# Patient Record
Sex: Female | Born: 1975
Health system: Southern US, Community
[De-identification: ages and names within clinical notes are randomized; demographics above are authoritative.]

## PROBLEM LIST (undated history)

## (undated) DIAGNOSIS — F419 Anxiety disorder, unspecified: Secondary | ICD-10-CM

## (undated) DIAGNOSIS — F32A Depression, unspecified: Secondary | ICD-10-CM

## (undated) DIAGNOSIS — F329 Major depressive disorder, single episode, unspecified: Secondary | ICD-10-CM

## (undated) DIAGNOSIS — R011 Cardiac murmur, unspecified: Secondary | ICD-10-CM

## (undated) DIAGNOSIS — L309 Dermatitis, unspecified: Secondary | ICD-10-CM

## (undated) DIAGNOSIS — T7840XA Allergy, unspecified, initial encounter: Secondary | ICD-10-CM

## (undated) HISTORY — DX: Dermatitis, unspecified: L30.9

## (undated) HISTORY — DX: Depression, unspecified: F32.A

## (undated) HISTORY — DX: Cardiac murmur, unspecified: R01.1

## (undated) HISTORY — DX: Allergy, unspecified, initial encounter: T78.40XA

## (undated) HISTORY — DX: Major depressive disorder, single episode, unspecified: F32.9

## (undated) HISTORY — DX: Anxiety disorder, unspecified: F41.9

---

## 2000-03-16 ENCOUNTER — Other Ambulatory Visit: Admission: RE | Admit: 2000-03-16 | Discharge: 2000-03-16 | Payer: Self-pay | Admitting: Gynecology

## 2001-02-22 ENCOUNTER — Other Ambulatory Visit: Admission: RE | Admit: 2001-02-22 | Discharge: 2001-02-22 | Payer: Self-pay | Admitting: Gynecology

## 2002-02-27 ENCOUNTER — Other Ambulatory Visit: Admission: RE | Admit: 2002-02-27 | Discharge: 2002-02-27 | Payer: Self-pay | Admitting: Gynecology

## 2002-05-04 ENCOUNTER — Encounter: Payer: Self-pay | Admitting: Emergency Medicine

## 2002-05-04 ENCOUNTER — Emergency Department (HOSPITAL_COMMUNITY): Admission: EM | Admit: 2002-05-04 | Discharge: 2002-05-04 | Payer: Self-pay | Admitting: Emergency Medicine

## 2003-03-17 ENCOUNTER — Other Ambulatory Visit: Admission: RE | Admit: 2003-03-17 | Discharge: 2003-03-17 | Payer: Self-pay | Admitting: Gynecology

## 2003-08-30 ENCOUNTER — Emergency Department (HOSPITAL_COMMUNITY): Admission: EM | Admit: 2003-08-30 | Discharge: 2003-08-30 | Payer: Self-pay

## 2004-05-10 ENCOUNTER — Other Ambulatory Visit: Admission: RE | Admit: 2004-05-10 | Discharge: 2004-05-10 | Payer: Self-pay | Admitting: Gynecology

## 2005-05-16 ENCOUNTER — Other Ambulatory Visit: Admission: RE | Admit: 2005-05-16 | Discharge: 2005-05-16 | Payer: Self-pay | Admitting: Gynecology

## 2006-05-17 ENCOUNTER — Other Ambulatory Visit: Admission: RE | Admit: 2006-05-17 | Discharge: 2006-05-17 | Payer: Self-pay | Admitting: Gynecology

## 2007-05-18 ENCOUNTER — Other Ambulatory Visit: Admission: RE | Admit: 2007-05-18 | Discharge: 2007-05-18 | Payer: Self-pay | Admitting: Gynecology

## 2008-06-16 ENCOUNTER — Emergency Department (HOSPITAL_COMMUNITY): Admission: EM | Admit: 2008-06-16 | Discharge: 2008-06-16 | Payer: Self-pay | Admitting: Emergency Medicine

## 2008-08-08 ENCOUNTER — Encounter: Admission: RE | Admit: 2008-08-08 | Discharge: 2008-08-08 | Payer: Self-pay

## 2008-10-24 ENCOUNTER — Encounter: Admission: RE | Admit: 2008-10-24 | Discharge: 2008-10-24 | Payer: Self-pay | Admitting: Family Medicine

## 2010-06-02 LAB — URINALYSIS, ROUTINE W REFLEX MICROSCOPIC
Bilirubin Urine: NEGATIVE
Glucose, UA: NEGATIVE mg/dL
Ketones, ur: NEGATIVE mg/dL
Leukocytes, UA: NEGATIVE
Nitrite: NEGATIVE
Protein, ur: 100 mg/dL — AB
Specific Gravity, Urine: 1.016 (ref 1.005–1.030)
Urobilinogen, UA: 0.2 mg/dL (ref 0.0–1.0)
pH: 5.5 (ref 5.0–8.0)

## 2010-06-02 LAB — URINE MICROSCOPIC-ADD ON

## 2010-06-02 LAB — POCT PREGNANCY, URINE: Preg Test, Ur: NEGATIVE

## 2010-10-13 IMAGING — US US RENAL
1 series · 14 of 25 positions shown · non-contrast
Comparison: None

CLINICAL DATA: Hematuria and proteinuria.

RENAL/URINARY TRACT ULTRASOUND COMPLETE

[Series 1: us renal · 0.24mm/px · 14 of 32 slices shown]
[im 1/32]
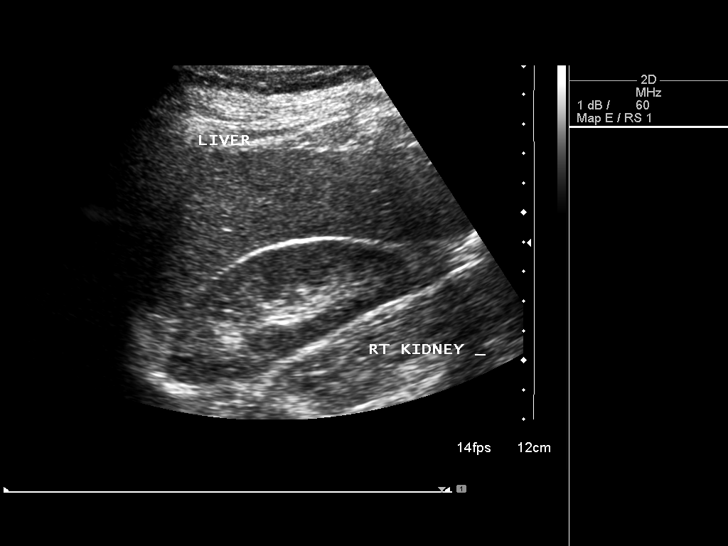
[im 3/32]
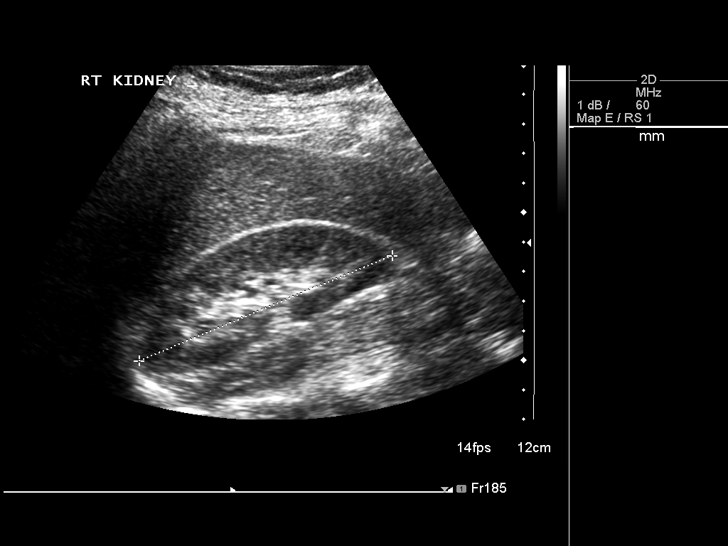
[im 6/32]
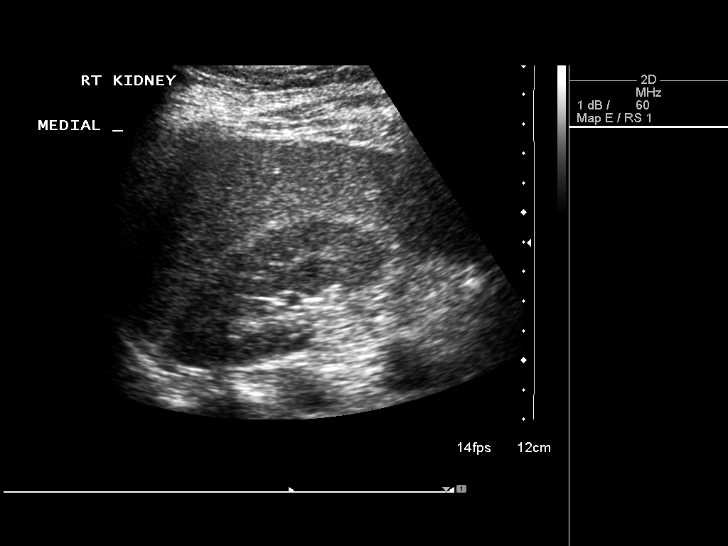
[im 8/32]
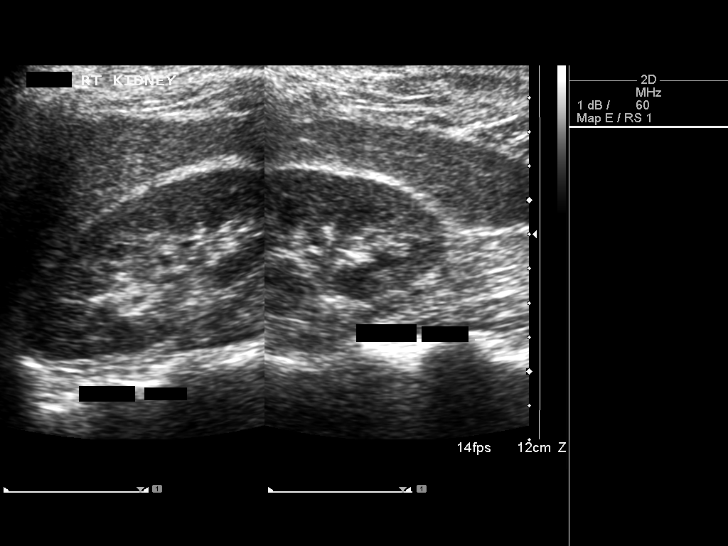
[im 11/32]
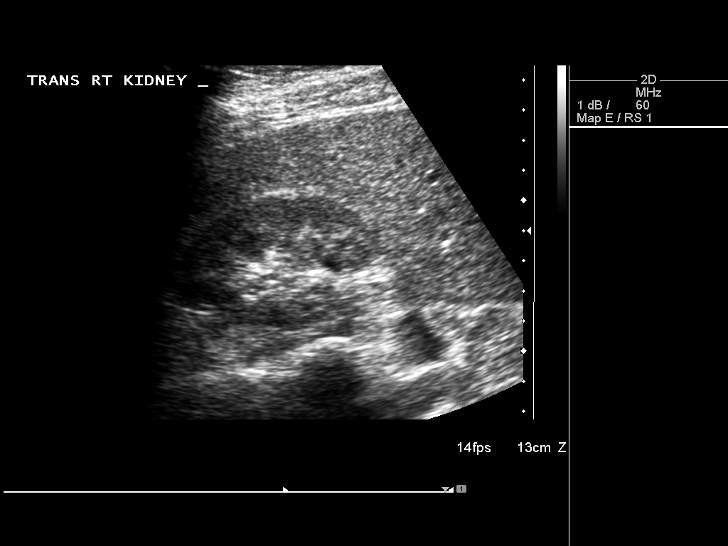
[im 12/32]
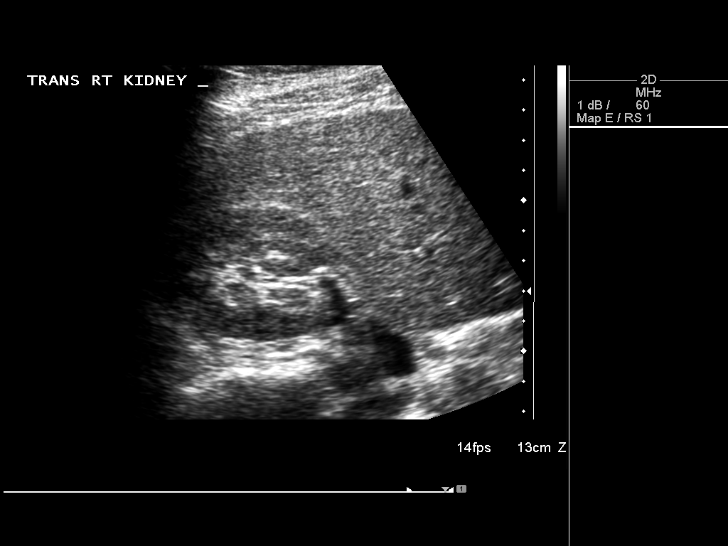
[im 15/32]
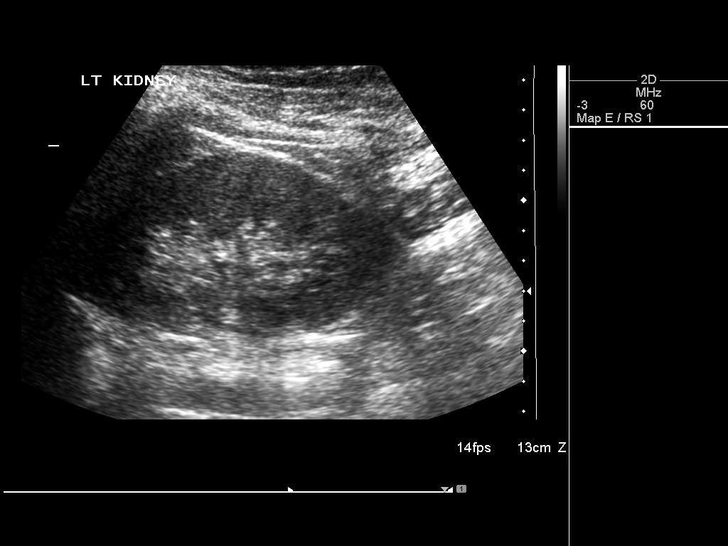
[im 17/32]
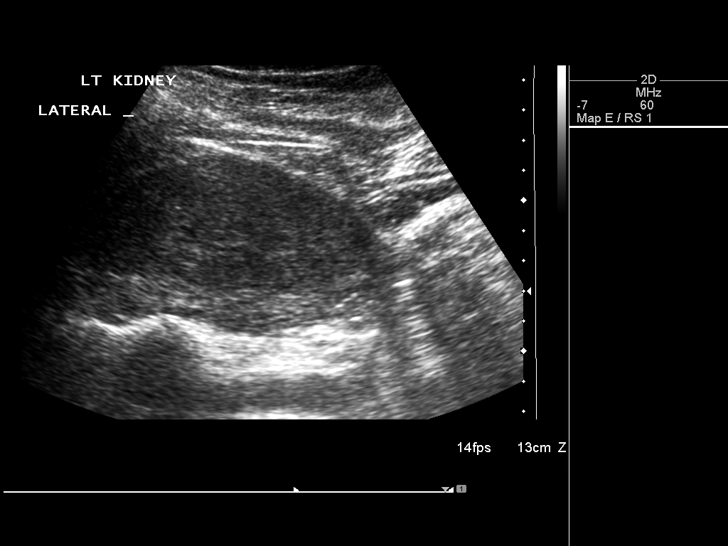
[im 20/32]
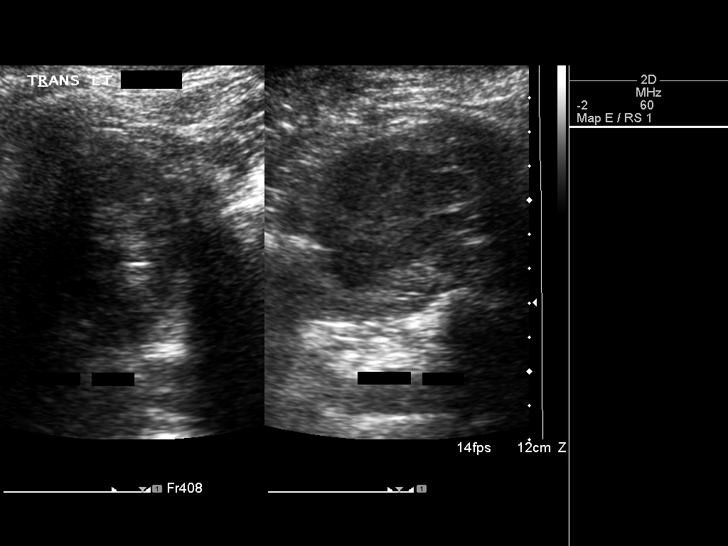
[im 21/32]
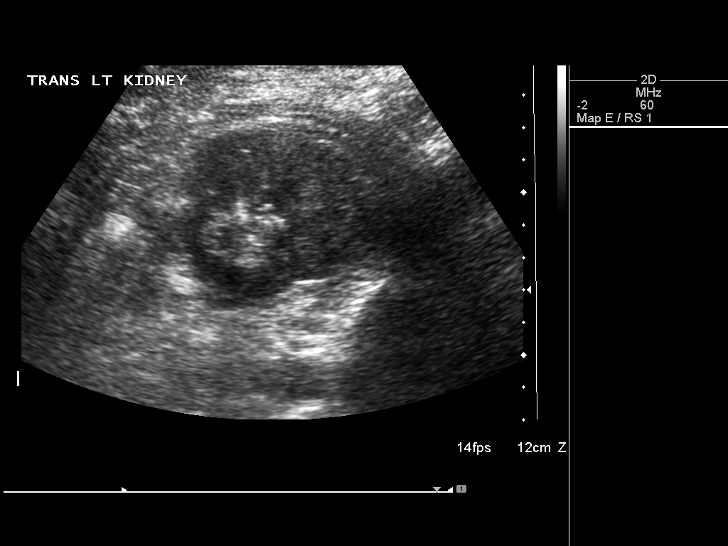
[im 24/32]
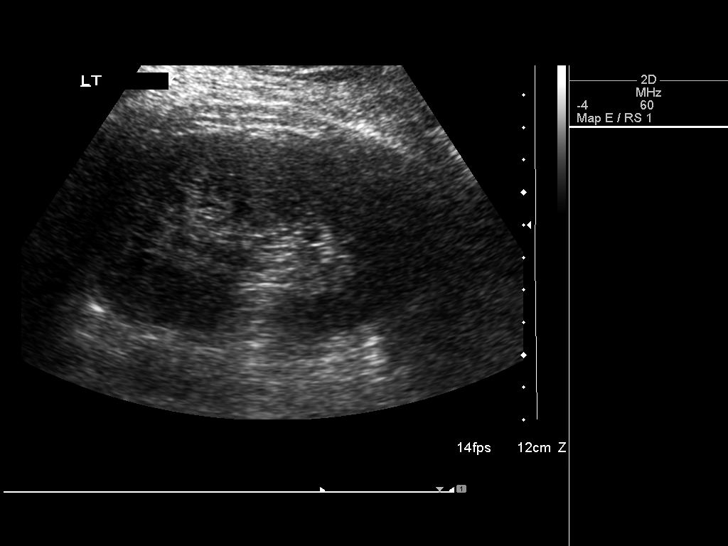
[im 26/32]
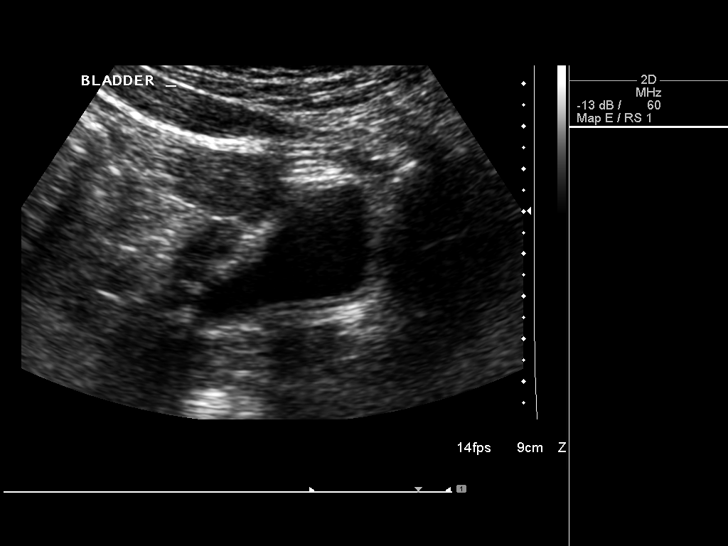
[im 29/32]
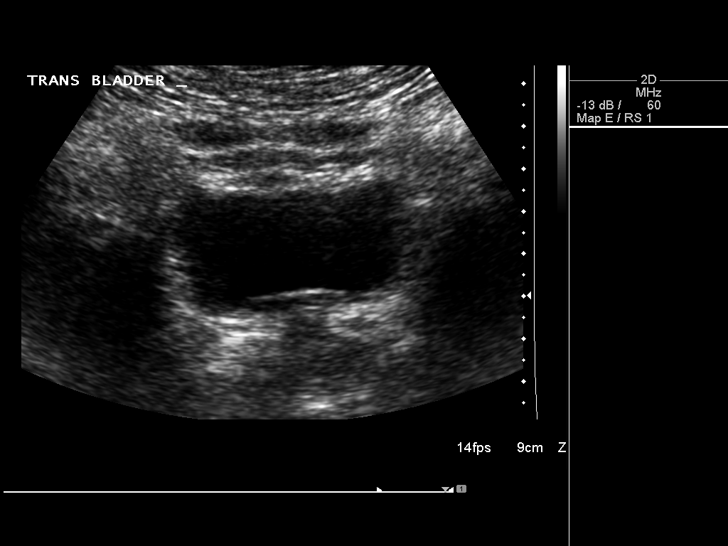
[im 32/32]
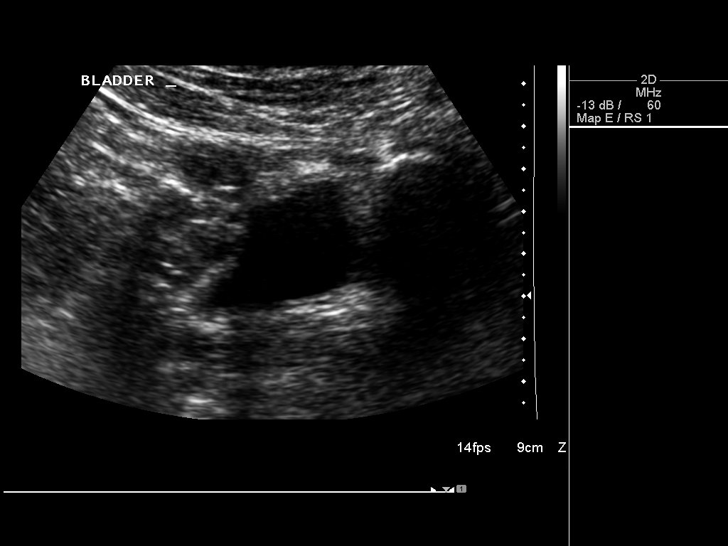

[14 of 25 positions shown; findings below may reference images not displayed]

FINDINGS: Right Kidney:  Measures 9.7 cm, negative.

Left Kidney:  Measures 10.8 cm, negative.

Bladder:  Negative.
IMPRESSION: Normal renal ultrasound.

## 2012-08-21 ENCOUNTER — Ambulatory Visit (INDEPENDENT_AMBULATORY_CARE_PROVIDER_SITE_OTHER): Payer: 59 | Admitting: Family

## 2012-08-21 ENCOUNTER — Encounter: Payer: Self-pay | Admitting: Family

## 2012-08-21 VITALS — BP 108/78 | HR 67 | Ht 64.75 in | Wt 181.5 lb

## 2012-08-21 DIAGNOSIS — F329 Major depressive disorder, single episode, unspecified: Secondary | ICD-10-CM

## 2012-08-21 DIAGNOSIS — N943 Premenstrual tension syndrome: Secondary | ICD-10-CM

## 2012-08-21 MED ORDER — BUPROPION HCL ER (XL) 150 MG PO TB24: 150.0000 mg | ORAL_TABLET | Freq: Every day | ORAL | Status: DC

## 2012-08-21 NOTE — Progress Notes (Signed)
  Subjective:    Patient ID: Natalie Gilbert, female    DOB: [birth year], 37 y.o.   MRN: [id number]  HPI Pt is a 37 year old [place] American female who presents to the PCP with feeling of depression. States sx appear 1-2 weeks prior to the start of her menstrual cycle. Denies her flow being overly painful, but reports a heavy flow. 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 Denies any pain, feelings of hopelessness or suicidal/homicidal ideations.    Review of Systems  Constitutional: Negative.   HENT: Negative.   Eyes: Negative.   Respiratory: Negative.   Cardiovascular: Negative.   Gastrointestinal: Negative.   Endocrine: Negative.   Genitourinary: Negative.   Musculoskeletal: Negative.   Skin: Negative.   Allergic/Immunologic: Negative.   Neurological: Negative.   Hematological: Negative.   Psychiatric/Behavioral: Positive for dysphoric mood.   Past Medical History  Diagnosis Date  . Heart murmur     History   Social History  . Marital Status: Single    Spouse Name: N/A    Number of Children: N/A  . Years of Education: N/A   Occupational History  . Not on file.   Social History Main Topics  . Smoking status: Never Smoker   . Smokeless tobacco: Not on file  . Alcohol Use: No  . Drug Use: No  . Sexually Active: Not on file   Other Topics Concern  . Not on file   Social History Narrative  . No narrative on file    No past surgical history on file.  Family History  Problem Relation Age of Onset  . Hypertension Mother   . Heart murmur Father   . Hypertension Brother     No Known Allergies  No current outpatient prescriptions on file prior to visit.   No current facility-administered medications on file prior to visit.    BP 108/78  Pulse 67  Ht 5' 4.75" (1.645 m)  Wt 181 lb 8 oz (82.328 kg)  BMI 30.42 kg/m2  SpO2 96%  LMP  06/27/2014chart    Objective:   Physical Exam  Constitutional: She is oriented to person, place, and time. She appears well-developed and well-nourished.  HENT:  Head: Normocephalic and atraumatic.  Eyes: Pupils are equal, round, and reactive to light.  Neck: Normal range of motion.  Cardiovascular: Normal rate and regular rhythm.   Pulmonary/Chest: Effort normal and breath sounds normal.  Abdominal: Soft.  Musculoskeletal: Normal range of motion.  Neurological: She is alert and oriented to person, place, and time.  Skin: Skin is warm and dry.          Assessment & Plan:  1. PMS 2. Depression  Pt prescribed daily Wellbutrin to manage symptoms of depression. Instructed to return to PCP in 4 weeks to evaluate the effectiveness of this therapy. Pt instructed to contact PCP sooner if any questions or concerns arise. Also instructed to set up annual physical for routine health maintenance, since pt can not recall date of last physical.

## 2012-08-21 NOTE — Patient Instructions (Addendum)
Premenstrual Syndrome Premenstrual syndrome (PMS) is a condition that consists of physical, emotional, and behavioral symptoms that affect women of childbearing age. PMS occurs 5 14 days before the start of a menstrual period and often recurs in a predictable pattern. The symptoms go away a few days after the menstrual period starts. PMS can interfere in many ways with normal daily activities and can range from mild to severe. When PMS is considered severe, it may be diagnosed as premenstrual dysphoric disorder (PMDD). A small percentage of women are affected by PMS symptoms and an even smaller percentage of those women are affected by PMDD.  CAUSES  The exact cause of PMS is unknown, but it seems to be related to cyclic hormone changes that happen before menstruation. These hormones are thought to affect chemicals in the brain (serotonin) that can influence a person's mood.  SYMPTOMS  Symptoms of PMS recur consistently from month to month and go away completely after the menstrual period starts. The most common emotional or behavioral symptom is mood swings. These mood swings can be disabling and interfere with normal activities of daily living. Other common symptoms include depression and angry outbursts. Other symptoms may include:   Irritability.  Anxiety.  Crying spells.   Food cravings or appetite changes.   Changes in sexual desire.   Confusion.   Aggression.   Social withdrawal.   Poor concentration. The most common physical symptoms include a sense of bloating, breast pain, headaches, and extreme fatigue. Other physical symptoms include:   Backaches.   Swelling of the hands and feet.   Weight gain.   Hot flashes.  DIAGNOSIS  To make a diagnosis, your caregiver will ask questions to confirm that you are having a pattern of symptoms. Symptoms must:   Be present 5 days before the start of your period and be present at least 3 months in a row.   End within 4 days  after your period starts.   Interfere with some of your normal activities.  Other conditions, such as thyroid disease, depression, and migraine headaches must be ruled out before a diagnosis of PMS is confirmed.  TREATMENT  Your caregiver may suggest ways to maintain a healthy lifestyle, such as exercise. Over-the-counter pain relievers may ease cramps, aches, pains, headaches, and breast tenderness. However, selective serotonin reuptake inhibitors (SSRIs) are medicines that are most beneficial in improving PMS if taken in the second half of the monthly cycle. They may be taken on a daily basis. The most effective oral contraceptive pill used for symptoms of PMS is one that contains the ingredient drospirenone. Taking 4 days off of the pill instead of the usual 7 days also has shown to increase effectiveness.  There are a number of drugs, dietary supplements, vitamins, and water pills (diuretics) which have been suggested to be helpful but have not shown to be of any benefit to improving PMS symptoms.  HOME CARE INSTRUCTIONS   For 2 3 months, write down your symptoms, their severity, and how long they last. This may help your caregiver prescribe the best treatment for your symptoms.  Exercise regularly as suggested by your caregiver.  Eat a regular, well-balanced diet.  Avoid caffeine, alcohol, and tobacco consumption.  Limit salt and salty foods to lessen bloating and fluid retention.  Get enough sleep. Practice relaxation techniques.  Drink enough fluids to keep your urine clear or pale yellow.  Take medicines as directed by your caregiver.  Limit stress.  Take a multivitamin as directed   by your caregiver. Document Released: 02/05/2000 Document Revised: 11/02/2011 Document Reviewed: 06/27/2011 Exit[employer] Patient Information 2014 [employer].  

## 2012-09-11 ENCOUNTER — Telehealth: Payer: Self-pay | Admitting: Family

## 2012-09-11 ENCOUNTER — Ambulatory Visit: Payer: 59 | Admitting: Emergency Medicine

## 2012-09-11 VITALS — BP 140/84 | HR 72 | Temp 98.6°F | Resp 16 | Ht 65.0 in | Wt 185.0 lb

## 2012-09-11 DIAGNOSIS — L500 Allergic urticaria: Secondary | ICD-10-CM

## 2012-09-11 MED ORDER — METHYLPREDNISOLONE ACETATE 80 MG/ML IJ SUSP
120.0000 mg | Freq: Once | INTRAMUSCULAR | Status: AC
  Administered 2012-09-11: 120 mg via INTRAMUSCULAR

## 2012-09-11 MED ORDER — CYPROHEPTADINE HCL 4 MG PO TABS: 4.0000 mg | ORAL_TABLET | Freq: Four times a day (QID) | ORAL | Status: DC | PRN

## 2012-09-11 NOTE — Telephone Encounter (Signed)
Noted  

## 2012-09-11 NOTE — Patient Instructions (Addendum)

## 2012-09-11 NOTE — Telephone Encounter (Signed)
Patient Information:  Caller Name: Chynah  Phone: [phone]  Patient: Natalie Gilbert  Gender: Female  DOB: 27-Dec-1975  Age: 37 Years  PCP: Adline Mango [facility])  Pregnant: No  Office Follow Up:  Does the office need to follow up with this patient?: No  Instructions For The Office: N/A  RN Note:  Today she is itching and having areas of redness from scratching-just on upper body-stomach up. Rates itching at a 5/10 itch scale. Has not done outside work since Saturday. Walked on Sunday and had shellfish.  Patient unable to identify a causative agent.  Triaged with care advice given.  Caller demonstrated her understanding and will call back if does not improve.  Symptoms  Reason For Call & Symptoms: Body itch  with lips swollen and break out on elbows 09/10/12; Has some of the same symptoms but lips and elbows have resolved  Reviewed Health History In EMR: Yes  Reviewed Medications In EMR: Yes  Reviewed Allergies In EMR: Yes  Reviewed Surgeries / Procedures: N/A  Date of Onset of Symptoms: 09/10/2012  Treatments Tried: benadryl  Treatments Tried Worked: Yes OB / GYN:  LMP: 08/10/2012  Guideline(s) Used:  Itching - Widespread  Disposition Per Guideline:   Home Care  Reason For Disposition Reached:   Itching of unknown cause and present < 48 hours  Advice Given:  Reassurance - Itching of Unknown Cause:  The most common cause of itching is dry skin. Itching can also be caused by soaps, chlorine, low humidity, and pollen or other irritants. Sometimes the cause is unknown.  With a few simple measures, the itching will usually get better in 1 to 2 days.  Here is some care advice that should help.  Reassurance - Itching from Swimming in a Chlorinated Pool:  For itching due to chlorine, rinse the chlorine off the body with a brief shower immediately after swimming.  Then apply a skin moisturizing cream.  Here is some more care advice that should help.  Reassurance - Itching from Pollen and Other Allergens:  You need to wash off the allergens  Take a shower to remove pollens, animal dander or other allergic substances from the body and hair.  Here is some more care advice that should help.  Reassurance - Itching from Dry Air:  For itching due to dry air, run a humidifier. This is mainly needed during the winter season when you are using central heat.  After you shower, use an unscented moisturizing lotion (e.g., Eucerin, Lubriderm, Vaseline Intensive Care). Eucerin Creme is especially helpful for dry/chapped hands.  Here is some more care advice that should help.  Don  Try not to scratch.  Itching is often worsened by scratching (the "Itch-Scratch" cycle).  Cut the fingernails short. (Reason: prevent secondary bacterial infection.)  Avoid Soaps:  Avoid all strong soaps (including bubble bath and scented soaps). (Reason: soaps remove natural oils from the skin.) Use gentler soaps like Dove, Olay, or Basis.  Avoid Triggers:  Avoid hot showers and baths. (Reason: heat increases itching.)  Avoid swimming pools. (Reason: chlorine dries out the skin.)  Avoid itchy or tight clothing (especially wool).  Avoid sleeping with too many blankets. (Reason: heat and sweating aggravates itching.)  Moisturize the Skin with Lotion:  The best time to apply lotion is right after a bath or shower when the skin is moist. The lotion or cream will help seal in the moisture.  Eucerin lotion, Lubriderm lotion, or Vaseline Intensive Care lotion all  work well.  Eucerin Creme is a little thicker and is especially helpful for dry/chapped hands.  Oral Antihistamine Medication for Itching:  Take an antihistamine by mouth to reduce the itching. Diphenhydramine (Benadryl) is available over-the-counter. Adult dose is 25-50 mg. Take it up to 4 times a day.  Call Back If:  Rash occurs  Itching becomes worse or lasts over 48 hours  You become worse.  Patient Will Follow  Care Advice:  YES

## 2012-09-11 NOTE — Progress Notes (Signed)
[facility] [address], [place] [address] [phone]  Date:  09/11/2012   Name:  Natalie Gilbert   DOB:  Jul 07, 1975   MRN:  [id number]  PCP:  Janell Quiet, FNP    Chief Complaint: Allergic Reaction   History of Present Illness:  Natalie Gilbert is a 37 y.o. very pleasant female patient who presents with the following:  Onset of hives yesterday.  Migratory. No shortness of breath or wheezing.  Had som swelling of the lips.  No new personal care product or medications.  No documented insect exposure.  No improvement with over the counter medications or other home remedies. Denies other complaint or health concern today.   There are no active problems to display for this patient.   Past Medical History  Diagnosis Date  . Heart murmur     No past surgical history on file.  History  Substance Use Topics  . Smoking status: Never Smoker   . Smokeless tobacco: Not on file  . Alcohol Use: No    Family History  Problem Relation Age of Onset  . Hypertension Mother   . Heart murmur Father   . Hypertension Brother     No Known Allergies  Medication list has been reviewed and updated.  Current Outpatient Prescriptions on File Prior to Visit  Medication Sig Dispense Refill  . buPROPion (WELLBUTRIN XL) 150 MG 24 hr tablet Take 1 tablet (150 mg total) by mouth daily.  30 tablet  1   No current facility-administered medications on file prior to visit.    Review of Systems:  As per HPI, otherwise negative.    Physical Examination: Filed Vitals:   09/11/12 1759  BP: 140/84  Pulse: 72  Temp: 98.6 F (37 C)  Resp: 16   Filed Vitals:   09/11/12 1759  Height: 5\' 5"  (1.651 m)  Weight: 185 lb (83.915 kg)   Body mass index is 30.79 kg/(m^2). Ideal Body Weight: Weight in (lb) to have BMI = 25: 149.9  GEN: WDWN, NAD, Non-toxic, A & O x 3 HEENT: Atraumatic, Normocephalic. Neck supple. No masses, No LAD. Ears and Nose: No external  deformity. CV: RRR, No M/G/R. No JVD. No thrill. No extra heart sounds. PULM: CTA B, no wheezes, crackles, rhonchi. No retractions. No resp. distress. No accessory muscle use. ABD: S, NT, ND, +BS. No rebound. No HSM. EXTR: No c/c/e NEURO Normal gait.  PSYCH: Normally interactive. Conversant. Not depressed or anxious appearing.  Calm demeanor.  SKIN:  hives  Assessment and Plan: Hives Depo Benadryl  Signed,  Phillips Odor, MD

## 2012-09-13 ENCOUNTER — Other Ambulatory Visit: Payer: Self-pay | Admitting: Radiology

## 2012-09-18 ENCOUNTER — Other Ambulatory Visit: Payer: Self-pay | Admitting: Radiology

## 2012-09-18 ENCOUNTER — Other Ambulatory Visit: Payer: Self-pay | Admitting: *Deleted

## 2012-09-18 MED ORDER — CYPROHEPTADINE HCL 4 MG PO TABS: 4.0000 mg | ORAL_TABLET | Freq: Four times a day (QID) | ORAL | Status: DC | PRN

## 2012-09-19 ENCOUNTER — Encounter: Payer: Self-pay | Admitting: Family

## 2012-09-19 ENCOUNTER — Ambulatory Visit (INDEPENDENT_AMBULATORY_CARE_PROVIDER_SITE_OTHER): Payer: 59 | Admitting: Family

## 2012-09-19 VITALS — BP 124/88 | HR 88 | Wt 183.0 lb

## 2012-09-19 DIAGNOSIS — F320 Major depressive disorder, single episode, mild: Secondary | ICD-10-CM | POA: Insufficient documentation

## 2012-09-19 DIAGNOSIS — F32A Depression, unspecified: Secondary | ICD-10-CM | POA: Insufficient documentation

## 2012-09-19 DIAGNOSIS — F329 Major depressive disorder, single episode, unspecified: Secondary | ICD-10-CM

## 2012-09-19 MED ORDER — BUPROPION HCL ER (XL) 150 MG PO TB24: 150.0000 mg | ORAL_TABLET | Freq: Every day | ORAL | Status: DC

## 2012-09-19 NOTE — Progress Notes (Signed)
  Subjective:    Patient ID: Natalie Gilbert, female    DOB: [birth year], 37 y.o.   MRN: [id number]  HPI 37 year old [place] American female, nonsmoker is in for recheck of depression. She's currently on Wellbutrin XL 150 mg once daily and tolerating it very well. She feels like it has definitely lightened her mood. Has minimal concerns of it possibly cause in her not being able to orgasm. However, she is okay with that at this point. Denies any feelings of helplessness, hopelessness, thoughts of death or dying.   Review of Systems  Constitutional: Negative.   Respiratory: Negative.   Cardiovascular: Negative.   Skin: Negative.   Psychiatric/Behavioral: Negative.    Past Medical History  Diagnosis Date  . Heart murmur     History   Social History  . Marital Status: Single    Spouse Name: N/A    Number of Children: N/A  . Years of Education: N/A   Occupational History  . Not on file.   Social History Main Topics  . Smoking status: Never Smoker   . Smokeless tobacco: Not on file  . Alcohol Use: No  . Drug Use: No  . Sexually Active: Not on file   Other Topics Concern  . Not on file   Social History Narrative  . No narrative on file    History reviewed. No pertinent past surgical history.  Family History  Problem Relation Age of Onset  . Hypertension Mother   . Heart murmur Father   . Hypertension Brother     No Known Allergies  Current Outpatient Prescriptions on File Prior to Visit  Medication Sig Dispense Refill  . cyproheptadine (PERIACTIN) 4 MG tablet Take 1 tablet (4 mg total) by mouth 4 (four) times daily as needed.  40 tablet  0   No current facility-administered medications on file prior to visit.    BP 124/88  Pulse 88  Wt 183 lb (83.008 kg)  BMI 30.45 kg/m2  SpO2 98%  LMP 06/27/2014chart    Objective:   Physical Exam  Constitutional: She is oriented to person, place, and time. She appears well-developed and well-nourished.  Neck: Neck  supple. No thyromegaly present.  Cardiovascular: Normal rate, regular rhythm and normal heart sounds.   Pulmonary/Chest: Effort normal and breath sounds normal.  Neurological: She is alert and oriented to person, place, and time.  Skin: Skin is warm and dry.  Psychiatric: She has a normal mood and affect.          Assessment & Plan:  Assessment: 1. Depression-improving  Plan: Continue current medications. Exercise daily. We'll follow the patient in 3 months and sooner as needed.

## 2012-10-08 ENCOUNTER — Telehealth: Payer: Self-pay | Admitting: Family

## 2012-10-08 MED ORDER — BUPROPION HCL ER (XL) 300 MG PO TB24: 300.0000 mg | ORAL_TABLET | Freq: Every day | ORAL | Status: DC

## 2012-10-08 NOTE — Telephone Encounter (Signed)
Pt called to speak to the nurse to see if she can increase her medication for anxiety. Rn tried to call pt and reached vm. Left vm to call the office back.

## 2012-10-08 NOTE — Telephone Encounter (Signed)
Sent to pharmacy, 300mg  Wellbutrin. Recheck in 3 weeks for OV

## 2012-10-08 NOTE — Telephone Encounter (Signed)
Does pt need to schedule OV?

## 2012-10-08 NOTE — Telephone Encounter (Signed)
Left message to advise pt of Padonda's note 

## 2012-10-19 ENCOUNTER — Ambulatory Visit (INDEPENDENT_AMBULATORY_CARE_PROVIDER_SITE_OTHER): Payer: 59 | Admitting: Family

## 2012-10-19 ENCOUNTER — Encounter: Payer: Self-pay | Admitting: Family

## 2012-10-19 VITALS — BP 118/80 | HR 81 | Ht 64.25 in | Wt 180.0 lb

## 2012-10-19 DIAGNOSIS — Z23 Encounter for immunization: Secondary | ICD-10-CM

## 2012-10-19 DIAGNOSIS — N912 Amenorrhea, unspecified: Secondary | ICD-10-CM

## 2012-10-19 DIAGNOSIS — Z0000 Encounter for general adult medical examination without abnormal findings: Secondary | ICD-10-CM

## 2012-10-19 DIAGNOSIS — R7989 Other specified abnormal findings of blood chemistry: Secondary | ICD-10-CM

## 2012-10-19 DIAGNOSIS — F329 Major depressive disorder, single episode, unspecified: Secondary | ICD-10-CM

## 2012-10-19 LAB — COMPREHENSIVE METABOLIC PANEL
ALT: 25 U/L (ref 0–35)
AST: 20 U/L (ref 0–37)
Albumin: 3.8 g/dL (ref 3.5–5.2)
BUN: 11 mg/dL (ref 6–23)
CO2: 24 mEq/L (ref 19–32)
Calcium: 9.3 mg/dL (ref 8.4–10.5)
Chloride: 107 mEq/L (ref 96–112)
GFR: 85.58 mL/min (ref >60.00)
Potassium: 4 mEq/L (ref 3.5–5.1)

## 2012-10-19 LAB — CBC WITH DIFFERENTIAL/PLATELET
Basophils Absolute: 0.1 10*3/uL (ref 0.0–0.1)
Eosinophils Relative: 1.5 % (ref 0.0–5.0)
HCT: 36.5 % (ref 36.0–46.0)
Hemoglobin: 12.1 g/dL (ref 12.0–15.0)
Lymphocytes Relative: 33 % (ref 12.0–46.0)
Lymphs Abs: 3.5 10*3/uL (ref 0.7–4.0)
Monocytes Relative: 5 % (ref 3.0–12.0)
Neutro Abs: 6.3 10*3/uL (ref 1.4–7.7)
RBC: 4.16 Mil/uL (ref 3.87–5.11)
RDW: 13.6 % (ref 11.5–14.6)
WBC: 10.5 10*3/uL (ref 4.5–10.5)

## 2012-10-19 LAB — LIPID PANEL
Cholesterol: 214 mg/dL — ABNORMAL HIGH (ref 0–200)
HDL: 46 mg/dL (ref >39.00)
Total CHOL/HDL Ratio: 5
Triglycerides: 151 mg/dL — ABNORMAL HIGH (ref 0.0–149.0)
VLDL: 30.2 mg/dL (ref 0.0–40.0)

## 2012-10-19 LAB — POCT URINALYSIS DIPSTICK
Nitrite, UA: NEGATIVE
Protein, UA: NEGATIVE
Spec Grav, UA: >=1.03
Urobilinogen, UA: 0.2

## 2012-10-19 MED ORDER — BUPROPION HCL ER (XL) 150 MG PO TB24: 150.0000 mg | ORAL_TABLET | Freq: Every day | ORAL | Status: DC

## 2012-10-19 NOTE — Patient Instructions (Addendum)
Exercise to Stay Healthy Exercise helps you become and stay healthy. EXERCISE IDEAS AND TIPS Choose exercises that:  You enjoy.  Fit into your day. You do not need to exercise really hard to be healthy. You can do exercises at a slow or medium level and stay healthy. You can:  Stretch before and after working out.  Try yoga, Pilates, or tai chi.  Lift weights.  Walk fast, swim, jog, run, climb stairs, bicycle, dance, or rollerskate.  Take aerobic classes. Exercises that burn about 150 calories:  Running 1  miles in 15 minutes.  Playing volleyball for 45 to 60 minutes.  Washing and waxing a car for 45 to 60 minutes.  Playing touch football for 45 minutes.  Walking 1  miles in 35 minutes.  Pushing a stroller 1  miles in 30 minutes.  Playing basketball for 30 minutes.  Raking leaves for 30 minutes.  Bicycling 5 miles in 30 minutes.  Walking 2 miles in 30 minutes.  Dancing for 30 minutes.  Shoveling snow for 15 minutes.  Swimming laps for 20 minutes.  Walking up stairs for 15 minutes.  Bicycling 4 miles in 15 minutes.  Gardening for 30 to 45 minutes.  Jumping rope for 15 minutes.  Washing windows or floors for 45 to 60 minutes. Document Released: 03/12/2010 Document Revised: 05/02/2011 Document Reviewed: 03/12/2010 Exit[employer] Patient Information 2014 [employer].  

## 2012-10-19 NOTE — Progress Notes (Signed)
Subjective:    Patient ID: Natalie Gilbert, female    DOB: [birth year], 37 y.o.   MRN: [id number]  HPI 37 year old [place] American female, nonsmoker is in for complete physical exam. Denies any concerns today. She has a history of depression and is currently taking Wellbutrin 150 mg once daily and tolerating it well. Denies any feelings of helplessness, hopelessness, thoughts of death or dying. She is under the care of gynecology for GYN care.  Reports missing her menstrual cycle in July and wants a pregnancy test.   Review of Systems  Constitutional: Negative.   HENT: Negative.   Eyes: Negative.   Respiratory: Negative.   Cardiovascular: Negative.   Gastrointestinal: Negative.   Endocrine: Negative.   Genitourinary: Negative.   Musculoskeletal: Negative.   Skin: Negative.   Allergic/Immunologic: Negative.   Neurological: Negative.   Hematological: Negative.   Psychiatric/Behavioral: Negative.    Past Medical History  Diagnosis Date  . Heart murmur     History   Social History  . Marital Status: Single    Spouse Name: N/A    Number of Children: N/A  . Years of Education: N/A   Occupational History  . Not on file.   Social History Main Topics  . Smoking status: Never Smoker   . Smokeless tobacco: Not on file  . Alcohol Use: No  . Drug Use: No  . Sexual Activity: Not on file   Other Topics Concern  . Not on file   Social History Narrative  . No narrative on file    No past surgical history on file.  Family History  Problem Relation Age of Onset  . Hypertension Mother   . Heart murmur Father   . Hypertension Brother     No Known Allergies  Current Outpatient Prescriptions on File Prior to Visit  Medication Sig Dispense Refill  . cyproheptadine (PERIACTIN) 4 MG tablet Take 1 tablet (4 mg total) by mouth 4 (four) times daily as needed.  40 tablet  0   No current facility-administered medications on file prior to visit.    BP 118/80  Pulse 81  Ht 5'  4.25" (1.632 m)  Wt 180 lb (81.647 kg)  BMI 30.65 kg/m2chart    Objective:   Physical Exam  Constitutional: She is oriented to person, place, and time. She appears well-developed and well-nourished.  HENT:  Head: Normocephalic and atraumatic.  Right Ear: External ear normal.  Left Ear: External ear normal.  Nose: Nose normal.  Mouth/Throat: Oropharynx is clear and moist.  Eyes: Conjunctivae and EOM are normal. Pupils are equal, round, and reactive to light.  Neck: Normal range of motion. Neck supple. No thyromegaly present.  Cardiovascular: Normal rate, regular rhythm, normal heart sounds and intact distal pulses.  Exam reveals no gallop and no friction rub.   No murmur heard. Pulmonary/Chest: Effort normal and breath sounds normal.  Abdominal: Soft. Bowel sounds are normal. She exhibits no distension. There is no tenderness. There is no rebound.  Musculoskeletal: Normal range of motion. She exhibits no edema and no tenderness.  Neurological: She is alert and oriented to person, place, and time. She has normal reflexes.  Skin: Skin is warm and dry.  Psychiatric: She has a normal mood and affect.          Assessment & Plan:  Assessment: 1. Complete physical exam 2. Depression  Plan: Lab said to include TSH, CBC, lipids, urine, urine pregnancy test without patient and the results. Encouraged healthy diet, exercise, monthly self  breast exams. We'll follow up with patient in the results of her labs, in 6 months and sooner as needed.

## 2012-12-21 ENCOUNTER — Ambulatory Visit: Payer: 59 | Admitting: Family

## 2013-01-28 ENCOUNTER — Other Ambulatory Visit: Payer: Self-pay

## 2013-01-28 MED ORDER — BUPROPION HCL ER (XL) 150 MG PO TB24: 150.0000 mg | ORAL_TABLET | Freq: Every day | ORAL | Status: DC

## 2013-02-05 ENCOUNTER — Other Ambulatory Visit: Payer: Self-pay

## 2013-02-05 MED ORDER — BUPROPION HCL ER (XL) 150 MG PO TB24: 150.0000 mg | ORAL_TABLET | Freq: Every day | ORAL | Status: DC

## 2013-02-05 NOTE — Telephone Encounter (Signed)
Request to change wellbutrin to 90 day supply for insurance coverage  done

## 2013-07-12 ENCOUNTER — Telehealth: Payer: Self-pay | Admitting: Family

## 2013-07-12 NOTE — Telephone Encounter (Signed)
Pt requested call back from triage RN for "dizziness/headache." Triage RN unable to reach pt and left vm asking her to call back.

## 2013-08-09 ENCOUNTER — Ambulatory Visit (INDEPENDENT_AMBULATORY_CARE_PROVIDER_SITE_OTHER): Payer: 59 | Admitting: Physician Assistant

## 2013-08-09 VITALS — BP 134/86 | HR 67 | Temp 99.1°F | Resp 18 | Ht 65.0 in | Wt 177.0 lb

## 2013-08-09 DIAGNOSIS — T783XXA Angioneurotic edema, initial encounter: Secondary | ICD-10-CM

## 2013-08-09 MED ORDER — METHYLPREDNISOLONE SODIUM SUCC 125 MG IJ SOLR
125.0000 mg | Freq: Once | INTRAMUSCULAR | Status: AC
  Administered 2013-08-09: 125 mg via INTRAMUSCULAR

## 2013-08-09 MED ORDER — RANITIDINE HCL 300 MG PO TABS: 300.0000 mg | ORAL_TABLET | Freq: Every day | ORAL | Status: DC

## 2013-08-09 MED ORDER — CETIRIZINE HCL 10 MG PO TABS: 10.0000 mg | ORAL_TABLET | Freq: Every day | ORAL | Status: DC

## 2013-08-09 MED ORDER — CETIRIZINE HCL 5 MG PO TABS
10.0000 mg | ORAL_TABLET | Freq: Once | ORAL | Status: AC
  Administered 2013-08-09: 10 mg via ORAL

## 2013-08-09 MED ORDER — RANITIDINE HCL 150 MG PO TABS
300.0000 mg | ORAL_TABLET | Freq: Once | ORAL | Status: AC
  Administered 2013-08-09: 300 mg via ORAL

## 2013-08-09 MED ORDER — PREDNISONE 10 MG PO TABS: ORAL_TABLET | ORAL | Status: AC

## 2013-08-09 NOTE — Progress Notes (Signed)
   Subjective:    Patient ID: Natalie Gilbert, female    DOB: 04/20/1975, 38 y.o.   MRN: [id number]  HPI Pt presents to clinic with upper lip swelling since last pm.  She ate [employer] last pm but she has had that before without problems.  She did eat a few chicken wings prior to that that she had not eaten before.  She noticed several hours later that she had swelling of her upper lip only and none to her lower.  She having no trouble breathing or feelings of tongue swelling.  She has never had this before.  She took a benadryl last pm.  She has noticed continued swelling since she woke up this am.   Review of Systems  Constitutional: Negative for fever and chills.  HENT: Positive for facial swelling.   Respiratory: Negative for cough and shortness of breath.        Objective:   Physical Exam  Vitals reviewed. Constitutional: She is oriented to person, place, and time. She appears well-developed and well-nourished.  HENT:  Head: Normocephalic and atraumatic.  Right Ear: External ear normal.  Left Ear: External ear normal.  Upper lip edematous.  No low lip swelling.  No tongue swelling.  Uvula is WNL.  Pulmonary/Chest: Effort normal.  Neurological: She is alert and oriented to person, place, and time.  Skin: Skin is warm and dry.  Psychiatric: She has a normal mood and affect. Her behavior is normal. Judgment and thought content normal.        Assessment & Plan:  Angioedema, initial encounter - Plan: cetirizine (ZYRTEC) tablet 10 mg, ranitidine (ZANTAC) tablet 300 mg, cetirizine (ZYRTEC) 10 MG tablet, ranitidine (ZANTAC) 300 MG tablet, methylPREDNISolone sodium succinate (SOLU-MEDROL) 125 mg/2 mL injection 125 mg, predniSONE (DELTASONE) 10 MG tablet  D/w pt warning signs and what to look for.  Her questions were answered and she agrees with the above plan.  Windell Hummingbird PA-C  [facility] 08/09/2013 1:41 PM

## 2013-08-09 NOTE — Patient Instructions (Signed)
Take the Zyrtec and zantac for at least 10 days.  Angioedema Angioedema is a sudden swelling of tissues, often of the skin. It can occur on the face or genitals or in the abdomen or other body parts. The swelling usually develops over a short period and gets better in 24 to 48 hours. It often begins during the night and is found when the person wakes up. The person may also get red, itchy patches of skin (hives). Angioedema can be dangerous if it involves swelling of the air passages.  Depending on the cause, episodes of angioedema may only happen once, come back in unpredictable patterns, or repeat for several years and then gradually fade away.  CAUSES  Angioedema can be caused by an allergic reaction to various triggers. It can also result from nonallergic causes, including reactions to drugs, immune system disorders, viral infections, or an abnormal gene that is passed to you from your parents (hereditary). For some people with angioedema, the cause is unknown.  Some things that can trigger angioedema include:   Foods.   Medicines, such as ACE inhibitors, ARBs, nonsteroidal anti-inflammatory agents, or estrogen.   Latex.   Animal saliva.   Insect stings.   Dyes used in X-rays.   Mild injury.   Dental work.  Surgery.  Stress.   Sudden changes in temperature.   Exercise. SIGNS AND SYMPTOMS   Swelling of the skin.  Hives. If these are present, there is also intense itching.  Redness in the affected area.   Pain in the affected area.  Swollen lips or tongue.  Breathing problems. This may happen if the air passages swell.  Wheezing. If internal organs are involved, there may be:   Nausea.   Abdominal pain.   Vomiting.   Difficulty swallowing.   Difficulty passing urine. DIAGNOSIS   Your health care provider will examine the affected area and take a medical and family history.  Various tests may be done to help determine the cause. Tests may  include:  Allergy skin tests to see if the problem is an allergic reaction.   Blood tests to check for hereditary angioedema.   Tests to check for underlying diseases that could cause the condition.   A review of your medicines, including over the counter medicines, may be done. TREATMENT  Treatment will depend on the cause of the angioedema. Possible treatments include:   Removal of anything that triggered the condition (such as stopping certain medicines).   Medicines to treat symptoms or prevent attacks. Medicines given may include:   Antihistamines.   Epinephrine injection.   Steroids.   Hospitalization may be required for severe attacks. If the air passages are affected, it can be an emergency. Tubes may need to be placed to keep the airway open. HOME CARE INSTRUCTIONS   Only take over-the-counter or prescription medicines as directed by your health care provider.  If you were given medicines for emergency allergy treatment, always carry them with you.  Wear a medical bracelet as directed by your health care provider.   Avoid known triggers. SEEK MEDICAL CARE IF:   You have repeat attacks of angioedema.   Your attacks are more frequent or more severe despite preventive measures.   You have hereditary angioedema and are considering having children. It is important to discuss the risks of passing the condition on to your children with your health care provider. SEEK IMMEDIATE MEDICAL CARE IF:   You have severe swelling of the mouth, tongue, or lips.  You have difficulty breathing.   You have difficulty swallowing.   You faint. MAKE SURE YOU:  Understand these instructions.  Will watch your condition.  Will get help right away if you are not doing well or get worse. Document Released: 04/18/2001 Document Revised: 11/28/2012 Document Reviewed: 10/01/2012 [facility] Patient Information 2015 [place], [place]. This information is not intended to replace  advice given to you by your health care provider. Make sure you discuss any questions you have with your health care provider.

## 2013-11-20 ENCOUNTER — Ambulatory Visit (INDEPENDENT_AMBULATORY_CARE_PROVIDER_SITE_OTHER): Payer: 59 | Admitting: Family

## 2013-11-20 ENCOUNTER — Encounter: Payer: Self-pay | Admitting: Family

## 2013-11-20 VITALS — BP 122/90 | HR 78 | Wt 181.8 lb

## 2013-11-20 DIAGNOSIS — R42 Dizziness and giddiness: Secondary | ICD-10-CM

## 2013-11-20 DIAGNOSIS — R030 Elevated blood-pressure reading, without diagnosis of hypertension: Secondary | ICD-10-CM

## 2013-11-20 DIAGNOSIS — F4323 Adjustment disorder with mixed anxiety and depressed mood: Secondary | ICD-10-CM

## 2013-11-20 DIAGNOSIS — IMO0001 Reserved for inherently not codable concepts without codable children: Secondary | ICD-10-CM

## 2013-11-20 LAB — CBC WITH DIFFERENTIAL/PLATELET
BASOS PCT: 0.7 % (ref 0.0–3.0)
Basophils Absolute: 0.1 10*3/uL (ref 0.0–0.1)
Eosinophils Absolute: 0.2 10*3/uL (ref 0.0–0.7)
Eosinophils Relative: 2.2 % (ref 0.0–5.0)
HCT: 36.3 % (ref 36.0–46.0)
HEMOGLOBIN: 11.7 g/dL — AB (ref 12.0–15.0)
Lymphocytes Relative: 35.9 % (ref 12.0–46.0)
Lymphs Abs: 3.4 10*3/uL (ref 0.7–4.0)
MCHC: 32.3 g/dL (ref 30.0–36.0)
MCV: 88.3 fl (ref 78.0–100.0)
MONOS PCT: 6.8 % (ref 3.0–12.0)
Monocytes Absolute: 0.7 10*3/uL (ref 0.1–1.0)
NEUTROS ABS: 5.2 10*3/uL (ref 1.4–7.7)
Neutrophils Relative %: 54.4 % (ref 43.0–77.0)
Platelets: 404 10*3/uL — ABNORMAL HIGH (ref 150.0–400.0)
RBC: 4.11 Mil/uL (ref 3.87–5.11)
RDW: 13.1 % (ref 11.5–15.5)
WBC: 9.6 10*3/uL (ref 4.0–10.5)

## 2013-11-20 LAB — COMPREHENSIVE METABOLIC PANEL
ALT: 23 U/L (ref 0–35)
AST: 29 U/L (ref 0–37)
Albumin: 3.9 g/dL (ref 3.5–5.2)
Alkaline Phosphatase: 54 U/L (ref 39–117)
BUN: 10 mg/dL (ref 6–23)
CO2: 24 meq/L (ref 19–32)
CREATININE: 1.3 mg/dL — AB (ref 0.4–1.2)
Calcium: 9.5 mg/dL (ref 8.4–10.5)
Chloride: 104 mEq/L (ref 96–112)
GFR: 57.69 mL/min — AB (ref >60.00)
GLUCOSE: 98 mg/dL (ref 70–99)
Potassium: 4.2 mEq/L (ref 3.5–5.1)
Sodium: 138 mEq/L (ref 135–145)
Total Bilirubin: 0.4 mg/dL (ref 0.2–1.2)
Total Protein: 7.7 g/dL (ref 6.0–8.3)

## 2013-11-20 LAB — TSH: TSH: 0.61 u[IU]/mL (ref 0.35–4.50)

## 2013-11-20 NOTE — Progress Notes (Signed)
   Subjective:    Patient ID: Natalie Gilbert, female    DOB: Feb 10, 1976, 38 y.o.   MRN: [id number]  HPI  38 year old [place] American female, nonsmoker with a history of depression is in today for complaints of feeling dizzy x3 days. Reports checking her blood pressure outside of the office and getting elevated blood pressure reading of 148/95. Reports increased stress at work recently working in [employer], [employer]. Overall she is still soft now. Dizziness is worse when going from a sitting to a standing position. Has had occasional headaches about once every other week. Denies any sneezing, cough, congestion, no blurred vision or double vision.  Review of Systems  Constitutional: Negative.   Respiratory: Negative.   Cardiovascular: Negative.   Gastrointestinal: Negative.   Endocrine: Negative.   Genitourinary: Negative.   Musculoskeletal: Negative.   Skin: Negative.   Allergic/Immunologic: Negative.   Neurological: Positive for headaches.  Hematological: Negative.   Psychiatric/Behavioral: Negative.        Objective:   Physical Exam  Constitutional: She is oriented to person, place, and time. She appears well-developed and well-nourished.  HENT:  Right Ear: External ear normal.  Left Ear: External ear normal.  Nose: Nose normal.  Mouth/Throat: Oropharynx is clear and moist.  Neck: Normal range of motion. Neck supple. No thyromegaly present.  Cardiovascular: Normal rate, regular rhythm and normal heart sounds.   Pulmonary/Chest: Effort normal and breath sounds normal.  Abdominal: Soft. Bowel sounds are normal.  Musculoskeletal: Normal range of motion.  Neurological: She is alert and oriented to person, place, and time.  Skin: Skin is warm and dry.  Psychiatric: She has a normal mood and affect.          Assessment & Plan:  Natalie Gilbert was seen today for hypertension and dizziness.  Diagnoses and associated orders for this visit:  Dizziness and giddiness - TSH - CBC with  Differential - CMP  Elevated blood pressure - TSH - CBC with Differential - CMP  Adjustment disorder with mixed anxiety and depressed mood    Overall, I believe her dizziness is most likely related to stress. Consider increase in weight or to 300 mg once daily. Will await labs to determine if there is an underlying cause.

## 2013-11-20 NOTE — Patient Instructions (Signed)
Stress and Stress Management Stress is a normal reaction to life events. It is what you feel when life demands more than you are used to or more than you can handle. Some stress can be useful. For example, the stress reaction can help you catch the last bus of the day, study for a test, or meet a deadline at work. But stress that occurs too often or for too long can cause problems. It can affect your emotional health and interfere with relationships and normal daily activities. Too much stress can weaken your immune system and increase your risk for physical illness. If you already have a medical problem, stress can make it worse. CAUSES  All sorts of life events may cause stress. An event that causes stress for one person may not be stressful for another person. Major life events commonly cause stress. These may be positive or negative. Examples include losing your job, moving into a new home, getting married, having a baby, or losing a loved one. Less obvious life events may also cause stress, especially if they occur day after day or in combination. Examples include working long hours, driving in traffic, caring for children, being in debt, or being in a difficult relationship. SIGNS AND SYMPTOMS Stress may cause emotional symptoms including, the following:  Anxiety. This is feeling worried, afraid, on edge, overwhelmed, or out of control.  Anger. This is feeling irritated or impatient.  Depression. This is feeling sad, down, helpless, or guilty.  Difficulty focusing, remembering, or making decisions. Stress may cause physical symptoms, including the following:   Aches and pains. These may affect your head, neck, back, stomach, or other areas of your body.  Tight muscles or clenched jaw.  Low energy or trouble sleeping. Stress may cause unhealthy behaviors, including the following:   Eating to feel better (overeating) or skipping meals.  Sleeping too little, too much, or both.  Working  too much or putting off tasks (procrastination).  Smoking, drinking alcohol, or using drugs to feel better. DIAGNOSIS  Stress is diagnosed through an assessment by your health care provider. Your health care provider will ask questions about your symptoms and any stressful life events.Your health care provider will also ask about your medical history and may order blood tests or other tests. Certain medical conditions and medicine can cause physical symptoms similar to stress. Mental illness can cause emotional symptoms and unhealthy behaviors similar to stress. Your health care provider may refer you to a mental health professional for further evaluation.  TREATMENT  Stress management is the recommended treatment for stress.The goals of stress management are reducing stressful life events and coping with stress in healthy ways.  Techniques for reducing stressful life events include the following:  Stress identification. Self-monitor for stress and identify what causes stress for you. These skills may help you to avoid some stressful events.  Time management. Set your priorities, keep a calendar of events, and learn to say "no." These tools can help you avoid making too many commitments. Techniques for coping with stress include the following:  Rethinking the problem. Try to think realistically about stressful events rather than ignoring them or overreacting. Try to find the positives in a stressful situation rather than focusing on the negatives.  Exercise. Physical exercise can release both physical and emotional tension. The key is to find a form of exercise you enjoy and do it regularly.  Relaxation techniques. These relax the body and mind. Examples include yoga, meditation, tai chi, biofeedback, deep  breathing, progressive muscle relaxation, listening to music, being out in nature, journaling, and other hobbies. Again, the key is to find one or more that you enjoy and can do  regularly.  Healthy lifestyle. Eat a balanced diet, get plenty of sleep, and do not smoke. Avoid using alcohol or drugs to relax.  Strong support network. Spend time with family, friends, or other people you enjoy being around.Express your feelings and talk things over with someone you trust. Counseling or talktherapy with a mental health professional may be helpful if you are having difficulty managing stress on your own. Medicine is typically not recommended for the treatment of stress.Talk to your health care provider if you think you need medicine for symptoms of stress. HOME CARE INSTRUCTIONS  Keep all follow-up visits as directed by your health care provider.  Take all medicines as directed by your health care provider. SEEK MEDICAL CARE IF:  Your symptoms get worse or you start having new symptoms.  You feel overwhelmed by your problems and can no longer manage them on your own. SEEK IMMEDIATE MEDICAL CARE IF:  You feel like hurting yourself or someone else. Document Released: 08/03/2000 Document Revised: 06/24/2013 Document Reviewed: 10/02/2012 [employer] Patient Information 2015 [employer]. This information is not intended to replace advice given to you by your health care provider. Make sure you discuss any questions you have with your health care provider.  

## 2014-03-15 ENCOUNTER — Other Ambulatory Visit: Payer: Self-pay | Admitting: Family

## 2014-06-09 ENCOUNTER — Other Ambulatory Visit: Payer: Self-pay | Admitting: Family

## 2014-07-10 ENCOUNTER — Other Ambulatory Visit: Payer: Self-pay | Admitting: Family

## 2014-07-13 ENCOUNTER — Other Ambulatory Visit: Payer: Self-pay | Admitting: Family

## 2014-07-21 ENCOUNTER — Other Ambulatory Visit: Payer: Self-pay | Admitting: Family

## 2014-08-13 ENCOUNTER — Encounter: Payer: Self-pay | Admitting: Family

## 2014-08-13 ENCOUNTER — Ambulatory Visit (INDEPENDENT_AMBULATORY_CARE_PROVIDER_SITE_OTHER): Payer: 59 | Admitting: Family

## 2014-08-13 VITALS — BP 124/88 | HR 85 | Temp 98.5°F | Wt 187.5 lb

## 2014-08-13 DIAGNOSIS — R21 Rash and other nonspecific skin eruption: Secondary | ICD-10-CM

## 2014-08-13 DIAGNOSIS — L282 Other prurigo: Secondary | ICD-10-CM

## 2014-08-13 DIAGNOSIS — M79672 Pain in left foot: Secondary | ICD-10-CM

## 2014-08-13 MED ORDER — CLOTRIMAZOLE-BETAMETHASONE 1-0.05 % EX CREA: 1.0000 "application " | TOPICAL_CREAM | Freq: Two times a day (BID) | CUTANEOUS | Status: DC

## 2014-08-13 NOTE — Progress Notes (Signed)
   Subjective:    Patient ID: Natalie Gilbert, female    DOB: May 16, 1975, 39 y.o.   MRN: [id number]  HPI  39 year old African-American female, nonsmoker is in today with complaints of a rash to the bottom of her left foot present 1 week. Initially noticed a rash approximately 6 months ago but has worsened over the last week. Describes it as burning and itching. Has not been taking anything for relief. Had similar symptoms in the past that she reports being seen by podiatry who did a steroid injections in her foot and the symptoms resolved.   Review of Systems  Constitutional: Negative.   Respiratory: Negative.   Cardiovascular: Negative.   Endocrine: Negative.   Genitourinary: Negative.   Musculoskeletal: Negative.   Skin: Positive for rash.  Psychiatric/Behavioral: Negative.   All other systems reviewed and are negative.  Past Medical History  Diagnosis Date  . Heart murmur   . Depression     History   Social History  . Marital Status: Single    Spouse Name: N/A  . Number of Children: N/A  . Years of Education: N/A   Occupational History  . Not on file.   Social History Main Topics  . Smoking status: Never Smoker   . Smokeless tobacco: Not on file  . Alcohol Use: No  . Drug Use: No  . Sexual Activity: Not on file   Other Topics Concern  . Not on file   Social History Narrative    No past surgical history on file.  Family History  Problem Relation Age of Onset  . Hypertension Mother   . Heart murmur Father   . Hypertension Brother     No Known Allergies  Current Outpatient Prescriptions on File Prior to Visit  Medication Sig Dispense Refill  . buPROPion (WELLBUTRIN XL) 150 MG 24 hr tablet TAKE 1 TABLET (150 MG TOTAL) BY MOUTH DAILY. 30 tablet 0  . MICROGESTIN FE 1/20 1-20 MG-MCG tablet      No current facility-administered medications on file prior to visit.    BP 124/88 mmHg  Pulse 85  Temp(Src) 98.5 F (36.9 C) (Oral)  Wt 187 lb 8 oz (85.049  kg)chart    Objective:   Physical Exam  Constitutional: She is oriented to person, place, and time. She appears well-developed and well-nourished.  HENT:  Mouth/Throat: Oropharynx is clear and moist.  Neck: Normal range of motion. Neck supple.  Cardiovascular: Normal rate, regular rhythm and normal heart sounds.   Pulmonary/Chest: Effort normal and breath sounds normal.  Musculoskeletal: Normal range of motion.  Neurological: She is alert and oriented to person, place, and time.  Skin: Rash noted. Rash is vesicular.     Vesicular rash noted to the plantar aspect of the left foot. Tender to palpation. Slightly erythematous.  Psychiatric: She has a normal mood and affect.          Assessment & Plan:  Diagnoses and all orders for this visit:  Rash and nonspecific skin eruption Orders: -     Ambulatory referral to Podiatry  Foot pain, left  Pruritic rash  Other orders -     clotrimazole-betamethasone (LOTRISONE) cream; Apply 1 application topically 2 (two) times daily.   Call the office with any questions or concerns. Recheck as scheduled and sooner as needed.

## 2014-08-13 NOTE — Progress Notes (Signed)
Pre visit review using our clinic review tool, if applicable. No additional management support is needed unless otherwise documented below in the visit note. 

## 2014-08-13 NOTE — Patient Instructions (Signed)

## 2014-10-04 ENCOUNTER — Other Ambulatory Visit: Payer: Self-pay | Admitting: Family

## 2014-10-17 ENCOUNTER — Telehealth: Payer: Self-pay | Admitting: Family

## 2014-10-17 ENCOUNTER — Other Ambulatory Visit: Payer: Self-pay | Admitting: *Deleted

## 2014-10-17 MED ORDER — BUPROPION HCL ER (XL) 150 MG PO TB24: 150.0000 mg | ORAL_TABLET | Freq: Every day | ORAL | Status: DC

## 2014-10-17 NOTE — Telephone Encounter (Signed)
Pt did not pu the buPROPion (WELLBUTRIN XL) 150 MG 24 hr tablet  Called in 8/15.  She states it was a 30 day and $89.00 Her insurance requires 90 day to get the discount.  Pt has made CPE w/ padonda on 9/6.  However she has been out of this med and would like to know if you would Please send in 90 day.  Pt aware to schedule new pt appt w/ Tommi Rumps after her visit 9/6.   [facility]

## 2014-10-17 NOTE — Telephone Encounter (Signed)
Rx sent in. Attempted to call patient to make aware. Left message for patient to call back

## 2014-10-28 ENCOUNTER — Ambulatory Visit (INDEPENDENT_AMBULATORY_CARE_PROVIDER_SITE_OTHER): Payer: 59 | Admitting: Family

## 2014-10-28 ENCOUNTER — Encounter: Payer: Self-pay | Admitting: Family

## 2014-10-28 VITALS — BP 148/90 | HR 86 | Temp 98.5°F | Ht 65.0 in | Wt 190.0 lb

## 2014-10-28 DIAGNOSIS — R030 Elevated blood-pressure reading, without diagnosis of hypertension: Secondary | ICD-10-CM

## 2014-10-28 DIAGNOSIS — F329 Major depressive disorder, single episode, unspecified: Secondary | ICD-10-CM

## 2014-10-28 DIAGNOSIS — F32A Depression, unspecified: Secondary | ICD-10-CM

## 2014-10-28 DIAGNOSIS — Z0000 Encounter for general adult medical examination without abnormal findings: Secondary | ICD-10-CM

## 2014-10-28 DIAGNOSIS — IMO0001 Reserved for inherently not codable concepts without codable children: Secondary | ICD-10-CM

## 2014-10-28 LAB — POCT URINALYSIS DIPSTICK
Bilirubin, UA: NEGATIVE
Glucose, UA: NEGATIVE
KETONES UA: NEGATIVE
LEUKOCYTES UA: NEGATIVE
Nitrite, UA: NEGATIVE
PROTEIN UA: NEGATIVE
RBC UA: POSITIVE
Spec Grav, UA: >=1.03
Urobilinogen, UA: 0.2
pH, UA: 5.5

## 2014-10-28 LAB — COMPREHENSIVE METABOLIC PANEL
ALT: 19 U/L (ref 0–35)
AST: 23 U/L (ref 0–37)
Albumin: 4.3 g/dL (ref 3.5–5.2)
Alkaline Phosphatase: 55 U/L (ref 39–117)
BUN: 13 mg/dL (ref 6–23)
CO2: 25 meq/L (ref 19–32)
Calcium: 9.7 mg/dL (ref 8.4–10.5)
Chloride: 102 mEq/L (ref 96–112)
Creatinine, Ser: 0.92 mg/dL (ref 0.40–1.20)
GFR: 87.83 mL/min (ref >60.00)
Glucose, Bld: 79 mg/dL (ref 70–99)
Potassium: 4.3 mEq/L (ref 3.5–5.1)
Sodium: 136 mEq/L (ref 135–145)
Total Bilirubin: 0.3 mg/dL (ref 0.2–1.2)
Total Protein: 7.5 g/dL (ref 6.0–8.3)

## 2014-10-28 LAB — CBC WITH DIFFERENTIAL/PLATELET
BASOS PCT: 0.7 % (ref 0.0–3.0)
Basophils Absolute: 0.1 10*3/uL (ref 0.0–0.1)
EOS PCT: 1.2 % (ref 0.0–5.0)
Eosinophils Absolute: 0.2 10*3/uL (ref 0.0–0.7)
HEMATOCRIT: 38.9 % (ref 36.0–46.0)
HEMOGLOBIN: 12.5 g/dL (ref 12.0–15.0)
LYMPHS PCT: 33.1 % (ref 12.0–46.0)
Lymphs Abs: 5.1 10*3/uL — ABNORMAL HIGH (ref 0.7–4.0)
MCHC: 32.2 g/dL (ref 30.0–36.0)
MCV: 88.9 fl (ref 78.0–100.0)
Monocytes Absolute: 0.6 10*3/uL (ref 0.1–1.0)
Monocytes Relative: 4 % (ref 3.0–12.0)
Neutro Abs: 9.4 10*3/uL — ABNORMAL HIGH (ref 1.4–7.7)
Neutrophils Relative %: 61 % (ref 43.0–77.0)
PLATELETS: 456 10*3/uL — AB (ref 150.0–400.0)
RBC: 4.38 Mil/uL (ref 3.87–5.11)
RDW: 13.3 % (ref 11.5–15.5)
WBC: 15.4 10*3/uL — ABNORMAL HIGH (ref 4.0–10.5)

## 2014-10-28 LAB — TSH: TSH: 5.19 u[IU]/mL — ABNORMAL HIGH (ref 0.35–4.50)

## 2014-10-28 MED ORDER — BUPROPION HCL ER (XL) 150 MG PO TB24: 150.0000 mg | ORAL_TABLET | Freq: Two times a day (BID) | ORAL | Status: DC

## 2014-10-28 NOTE — Progress Notes (Signed)
Subjective:    Patient ID: Natalie Gilbert, female    DOB: 12-13-1975, 39 y.o.   MRN: [id number]  HPI 39 year old African-American female, nonsmoker with a history of depression is in today for complete physical exam. Well except having intermittent flashing and PMS symptoms about a week before her menstrual cycle starts. Reports feeling more anxious frustrated and down then the symptoms resolved. Continues to tolerate Wellbutrin 150 mg once daily well. Has an appointment with gynecology next month for Pap and pelvic. Does not routinely exercise. Has a family history of hypertension   Review of Systems  Constitutional: Negative.   HENT: Negative.   Eyes: Negative.   Respiratory: Negative.   Cardiovascular: Negative.   Gastrointestinal: Negative.   Endocrine: Negative.   Genitourinary: Negative.   Musculoskeletal: Negative.   Skin: Negative.   Allergic/Immunologic: Negative.   Neurological: Negative.   Hematological: Negative.   Psychiatric/Behavioral: Negative.    Past Medical History  Diagnosis Date  . Heart murmur   . Depression     Social History   Social History  . Marital Status: Single    Spouse Name: N/A  . Number of Children: N/A  . Years of Education: N/A   Occupational History  . Not on file.   Social History Main Topics  . Smoking status: Never Smoker   . Smokeless tobacco: Not on file  . Alcohol Use: No  . Drug Use: No  . Sexual Activity: Not on file   Other Topics Concern  . Not on file   Social History Narrative    History reviewed. No pertinent past surgical history.  Family History  Problem Relation Age of Onset  . Hypertension Mother   . Heart murmur Father   . Hypertension Brother     No Known Allergies  Current Outpatient Prescriptions on File Prior to Visit  Medication Sig Dispense Refill  . clotrimazole-betamethasone (LOTRISONE) cream Apply 1 application topically 2 (two) times daily. 45 g 0  . MICROGESTIN FE 1/20 1-20 MG-MCG  tablet      No current facility-administered medications on file prior to visit.    BP 148/90 mmHg  Pulse 86  Temp(Src) 98.5 F (36.9 C) (Oral)  Ht 5\' 5"  (1.651 m)  Wt 190 lb (86.183 kg)  BMI 31.62 kg/m2  SpO2 99%  LMP 10/21/2014 (Exact Date)chart    Objective:   Physical Exam  Constitutional: She is oriented to person, place, and time. She appears well-developed and well-nourished.  HENT:  Right Ear: External ear normal.  Left Ear: External ear normal.  Nose: Nose normal.  Mouth/Throat: Oropharynx is clear and moist.  Eyes: Conjunctivae are normal. Pupils are equal, round, and reactive to light.  Neck: Normal range of motion. Neck supple. No thyromegaly present.  Cardiovascular: Normal rate, regular rhythm and normal heart sounds.   Recheck blood pressure 138/90  Pulmonary/Chest: Effort normal and breath sounds normal.  Abdominal: Soft. Bowel sounds are normal.  Musculoskeletal: Normal range of motion.  Neurological: She is alert and oriented to person, place, and time. She has normal reflexes.  Skin: Skin is warm and dry.  Psychiatric: She has a normal mood and affect.          Assessment & Plan:  Roann was seen today for annual exam.  Diagnoses and all orders for this visit:  Annual physical exam -     POC Urinalysis Dipstick -     CMP -     TSH -     CBC  with Differential  Depression  Elevated blood pressure  Other orders -     buPROPion (WELLBUTRIN XL) 150 MG 24 hr tablet; Take 1 tablet (150 mg total) by mouth 2 (two) times daily.   Maintain a blood pressure diary. To help reduce blood pressure, exercise 45 minutes 4 days a week. Low-sodium diet. Increase Wellbutrin to 300 mg once daily. Recheck in 3-4 weeks.

## 2014-10-28 NOTE — Progress Notes (Signed)
Pre visit review using our clinic review tool, if applicable. No additional management support is needed unless otherwise documented below in the visit note. 

## 2014-10-28 NOTE — Patient Instructions (Signed)
Exercise to Lose Weight Exercise and a healthy diet may help you lose weight. Your doctor may suggest specific exercises. EXERCISE IDEAS AND TIPS  Choose low-cost things you enjoy doing, such as walking, bicycling, or exercising to workout videos.  Take stairs instead of the elevator.  Walk during your lunch break.  Park your car further away from work or school.  Go to a gym or an exercise class.  Start with 5 to 10 minutes of exercise each day. Build up to 30 minutes of exercise 4 to 6 days a week.  Wear shoes with good support and comfortable clothes.  Stretch before and after working out.  Work out until you breathe harder and your heart beats faster.  Drink extra water when you exercise.  Do not do so much that you hurt yourself, feel dizzy, or get very short of breath. Exercises that burn about 150 calories:  Running 1  miles in 15 minutes.  Playing volleyball for 45 to 60 minutes.  Washing and waxing a car for 45 to 60 minutes.  Playing touch football for 45 minutes.  Walking 1  miles in 35 minutes.  Pushing a stroller 1  miles in 30 minutes.  Playing basketball for 30 minutes.  Raking leaves for 30 minutes.  Bicycling 5 miles in 30 minutes.  Walking 2 miles in 30 minutes.  Dancing for 30 minutes.  Shoveling snow for 15 minutes.  Swimming laps for 20 minutes.  Walking up stairs for 15 minutes.  Bicycling 4 miles in 15 minutes.  Gardening for 30 to 45 minutes.  Jumping rope for 15 minutes.  Washing windows or floors for 45 to 60 minutes. Document Released: 03/12/2010 Document Revised: 05/02/2011 Document Reviewed: 03/12/2010 [employer] Patient Information 2015 [employer]. This information is not intended to replace advice given to you by your health care provider. Make sure you discuss any questions you have with your health care provider.  

## 2014-11-04 ENCOUNTER — Telehealth: Payer: Self-pay | Admitting: Family

## 2014-11-04 NOTE — Telephone Encounter (Signed)
Called patient and left voicemail to call office back. 

## 2014-11-04 NOTE — Telephone Encounter (Signed)
Pt following up to get results . Pt saw on mychart but has questions concerning some of the numbers. pls cb

## 2014-11-04 NOTE — Telephone Encounter (Signed)
Pt would like blood work results ok to leave on ans machine the results

## 2014-11-05 ENCOUNTER — Other Ambulatory Visit: Payer: Self-pay | Admitting: Family

## 2014-11-05 DIAGNOSIS — D72829 Elevated white blood cell count, unspecified: Secondary | ICD-10-CM

## 2014-11-05 DIAGNOSIS — R7989 Other specified abnormal findings of blood chemistry: Secondary | ICD-10-CM

## 2014-11-05 NOTE — Telephone Encounter (Signed)
Pt is calling back needing blood work results °

## 2014-11-05 NOTE — Telephone Encounter (Signed)
Left a message on home/cell for a return call.  See result note.  Will now close the note.

## 2014-11-12 ENCOUNTER — Encounter: Payer: Self-pay | Admitting: Family

## 2014-11-12 ENCOUNTER — Other Ambulatory Visit: Payer: Self-pay | Admitting: Family Medicine

## 2014-11-12 DIAGNOSIS — R7989 Other specified abnormal findings of blood chemistry: Secondary | ICD-10-CM

## 2014-11-12 DIAGNOSIS — D72829 Elevated white blood cell count, unspecified: Secondary | ICD-10-CM

## 2014-11-24 ENCOUNTER — Other Ambulatory Visit (INDEPENDENT_AMBULATORY_CARE_PROVIDER_SITE_OTHER): Payer: 59

## 2014-11-24 DIAGNOSIS — E039 Hypothyroidism, unspecified: Secondary | ICD-10-CM | POA: Diagnosis not present

## 2014-11-24 DIAGNOSIS — D72829 Elevated white blood cell count, unspecified: Secondary | ICD-10-CM | POA: Diagnosis not present

## 2014-11-24 LAB — CBC WITH DIFFERENTIAL/PLATELET
BASOS ABS: 0.1 10*3/uL (ref 0.0–0.1)
BASOS PCT: 1 % (ref 0.0–3.0)
EOS ABS: 0.3 10*3/uL (ref 0.0–0.7)
Eosinophils Relative: 2 % (ref 0.0–5.0)
HEMATOCRIT: 37 % (ref 36.0–46.0)
HEMOGLOBIN: 12.1 g/dL (ref 12.0–15.0)
LYMPHS PCT: 34.4 % (ref 12.0–46.0)
Lymphs Abs: 4.4 10*3/uL — ABNORMAL HIGH (ref 0.7–4.0)
MCHC: 32.8 g/dL (ref 30.0–36.0)
MCV: 87 fl (ref 78.0–100.0)
MONO ABS: 0.6 10*3/uL (ref 0.1–1.0)
Monocytes Relative: 4.3 % (ref 3.0–12.0)
Neutro Abs: 7.5 10*3/uL (ref 1.4–7.7)
Neutrophils Relative %: 58.3 % (ref 43.0–77.0)
Platelets: 429 10*3/uL — ABNORMAL HIGH (ref 150.0–400.0)
RBC: 4.25 Mil/uL (ref 3.87–5.11)
RDW: 13.3 % (ref 11.5–15.5)
WBC: 12.8 10*3/uL — AB (ref 4.0–10.5)

## 2014-11-24 LAB — T3, FREE: T3 FREE: 3.7 pg/mL (ref 2.3–4.2)

## 2014-11-24 LAB — T4, FREE: FREE T4: 0.78 ng/dL (ref 0.60–1.60)

## 2015-01-11 ENCOUNTER — Other Ambulatory Visit: Payer: Self-pay | Admitting: Family

## 2015-04-13 ENCOUNTER — Other Ambulatory Visit: Payer: Self-pay | Admitting: Family

## 2015-04-22 ENCOUNTER — Other Ambulatory Visit: Payer: Self-pay | Admitting: Family

## 2015-06-23 ENCOUNTER — Other Ambulatory Visit: Payer: Self-pay | Admitting: Family Medicine

## 2015-06-23 NOTE — Telephone Encounter (Signed)
Last seen 10/28/14 Last filled 08/13/14. Please advise

## 2015-06-25 ENCOUNTER — Other Ambulatory Visit: Payer: Self-pay | Admitting: General Practice

## 2015-06-25 ENCOUNTER — Telehealth: Payer: Self-pay | Admitting: General Practice

## 2015-06-25 MED ORDER — CLOTRIMAZOLE-BETAMETHASONE 1-0.05 % EX CREA: 1.0000 "application " | TOPICAL_CREAM | Freq: Two times a day (BID) | CUTANEOUS | Status: DC

## 2015-06-25 NOTE — Telephone Encounter (Signed)
-----   Message from Marletta Lor, MD sent at 06/25/2015 10:51 AM EDT ----- Regarding: RE: re-fill Okay to refill ----- Message -----    From: Warden Fillers, RN    Sent: 06/25/2015   9:51 AM      To: Marletta Lor, MD Subject: re-fill                                        Dr. Raliegh Ip.  Patient wants Clotrimazole/Betameth Cream re-filled.  She last saw Susa Simmonds in September of 2016 for a physical.  OK to fill?  Please advise.  Thanks, [employer]

## 2015-06-25 NOTE — Telephone Encounter (Signed)
Refill sent.

## 2015-06-30 ENCOUNTER — Other Ambulatory Visit: Payer: Self-pay | Admitting: General Practice

## 2016-05-18 NOTE — Progress Notes (Addendum)
HPI:   Natalie Gilbert is a 41 y.o. female, who is here today to establish care.  Former PCP: Ms Justin Mend Last preventive routine visit: 2016. She follows with her gyn regularly,last OV 11/2015.   Chronic medical problems: Depression.  She is not longer on Wellbutrin XL 150 mg bid, she decided to wean it off. She denies depressed mood or suicidal thoughts.   Concerns today: "hot flashes"  For over a year she has had episodes of hot flash that starts on upper chest and spread to her face. Sometimes associated with sweating but "not bad." Episodes happens during the day and night. She denies tremor,palpitations,diarrhea, or abnormal wt loss. + Increased appetite.  LMP 05/06/16. She has been on OCP,which she has not taken consistently. She stopped OCP's for 6 months and recently resumed.She did not note any difference on intensity or frequency of episodes. She states that she has not addressed this problem with her gynecologists. She has not tried OTC medications.  She has not noted exacerbating or alleviating factors.  M:13-14 yo. G:0  Lab Results  Component Value Date   TSH 5.19 (H) 10/28/2014   + "irritable", mood swings. + Dyspareunia with sex intercourse,just at the beginning. She denies pelvic pain,vaginal discharge,or abnormal bleeding.   Sister started with similar symptoms in her late 34's and now she is 51,post menopausal.  Difficulty losing wt,she does not exercise regularly and does not follow a healthy diet.   Review of Systems  Constitutional: Positive for appetite change. Negative for activity change, fatigue and fever.  HENT: Negative for mouth sores, nosebleeds and trouble swallowing.   Respiratory: Negative for cough, shortness of breath and wheezing.   Cardiovascular: Negative for chest pain, palpitations and leg swelling.  Gastrointestinal: Negative for abdominal pain, nausea and vomiting.       Negative for changes in bowel habits.  Endocrine:  Positive for heat intolerance. Negative for cold intolerance, polydipsia, polyphagia and polyuria.  Genitourinary: Positive for dyspareunia. Negative for decreased urine volume, dysuria, hematuria, menstrual problem, vaginal bleeding and vaginal discharge.  Musculoskeletal: Negative for back pain and myalgias.  Skin: Negative for rash.  Neurological: Negative for tremors, syncope, weakness and headaches.  Psychiatric/Behavioral: Negative for confusion, dysphoric mood, sleep disturbance and suicidal ideas. The patient is nervous/anxious.       Current Outpatient Prescriptions on File Prior to Visit  Medication Sig Dispense Refill  . clotrimazole-betamethasone (LOTRISONE) cream Apply 1 application topically 2 (two) times daily. 45 g 0  . MICROGESTIN FE 1/20 1-20 MG-MCG tablet      No current facility-administered medications on file prior to visit.      Past Medical History:  Diagnosis Date  . Depression   . Heart murmur    No Known Allergies  Family History  Problem Relation Age of Onset  . Hypertension Mother   . Heart murmur Father   . Hypertension Brother   . Diabetes Maternal Grandmother     Social History   Social History  . Marital status: Single    Spouse name: N/A  . Number of children: N/A  . Years of education: N/A   Social History Main Topics  . Smoking status: Never Smoker  . Smokeless tobacco: Never Used  . Alcohol use No  . Drug use: No  . Sexual activity: Yes    Birth control/ protection: Spermicide   Other Topics Concern  . None   Social History Narrative  . None    Vitals:  05/19/16 1516  BP: 124/78  Pulse: (!) 107  Resp: 12   O2 sat 98% at RA. Body mass index is 32.51 kg/m.  Physical Exam  Constitutional: She is oriented to person, place, and time. She appears well-developed. No distress.  HENT:  Head: Atraumatic.  Mouth/Throat: Oropharynx is clear and moist and mucous membranes are normal.  Eyes: Conjunctivae and EOM are  normal. Pupils are equal, round, and reactive to light.  Neck: No tracheal deviation present. No thyroid mass and no thyromegaly present.  Cardiovascular: Regular rhythm.  Tachycardia present.   Murmur (SEM I-VI RUSB and LUSB) heard. Pulses:      Dorsalis pedis pulses are 2+ on the right side, and 2+ on the left side.  Respiratory: Effort normal and breath sounds normal. No respiratory distress.  GI: Soft. She exhibits no mass. There is no hepatomegaly. There is no tenderness.  Musculoskeletal: She exhibits no edema.  Lymphadenopathy:    She has no cervical adenopathy.  Neurological: She is alert and oriented to person, place, and time. She has normal strength. Coordination and gait normal.  Skin: Skin is warm. No erythema.  Psychiatric: Her mood appears anxious.  Well groomed, good eye contact.      ASSESSMENT AND PLAN:   Kyndahl was seen today for establish care.  Diagnoses and all orders for this visit:  Hot flashes  We discussed possible causes:perimenopausal seems to be the most likely one. We also discussed a few treatment options,prescription med,OTC products. Changes (low carb diet) and yoga might help.  She is not interested in Paroxetine or Effexor. She can try OTC Stroven or [employer]. Further recommendations will be given according to lab results.    -     TSH; Future -     T4, free; Future -     Basic metabolic panel; Future  Obesity (BMI 30-39.9)  We discussed benefits of wt loss as well as adverse effects of obesity. Consistency with healthy diet and physical activity recommended. Daily brisk walking for 15-30 min as tolerated.  -     Basic metabolic panel; Future  Abnormal TSH  Further recommendations will be given according to lab results.   -     TSH; Future -     T4, free; Future       Ronte Parker G. Martinique, MD  [facility]. [place] office.

## 2016-05-19 ENCOUNTER — Ambulatory Visit (INDEPENDENT_AMBULATORY_CARE_PROVIDER_SITE_OTHER): Payer: 59 | Admitting: Family Medicine

## 2016-05-19 ENCOUNTER — Encounter: Payer: Self-pay | Admitting: Family Medicine

## 2016-05-19 VITALS — BP 124/78 | HR 107 | Resp 12 | Ht 65.0 in | Wt 195.4 lb

## 2016-05-19 DIAGNOSIS — R946 Abnormal results of thyroid function studies: Secondary | ICD-10-CM

## 2016-05-19 DIAGNOSIS — R7989 Other specified abnormal findings of blood chemistry: Secondary | ICD-10-CM

## 2016-05-19 DIAGNOSIS — E669 Obesity, unspecified: Secondary | ICD-10-CM

## 2016-05-19 DIAGNOSIS — R232 Flushing: Secondary | ICD-10-CM | POA: Diagnosis not present

## 2016-05-19 NOTE — Progress Notes (Signed)
Pre visit review using our clinic review tool, if applicable. No additional management support is needed unless otherwise documented below in the visit note. 

## 2016-05-19 NOTE — Patient Instructions (Addendum)
A few things to remember from today's visit:   Hot flashes - Plan: TSH, T4, free, Basic metabolic panel  Stroven or [employer]  What are some tips for weight loss? People become overweight for many reasons. Weight issues can run in families. They can be caused by unhealthy behaviors and a person's environment. Certain health problems and medicines can also lead to weight gain. There are some simple things you can do to reach and maintain a healthy weight:  Eat small more frequent healthy meals instead 3 bid meals. Also Weight Watchers is a good option. Avoid sweet drinks. These include regular soft drinks, fruit juices, fruit drinks, energy drinks, sweetened iced tea, and flavored milk. Avoid fast foods. Fast foods such as french fries, hamburgers, chicken nuggets, and pizza are high in calories and can cause weight gain. Eat a healthy breakfast. People who skip breakfast tend to weigh more. Don't watch more than two hours of television per day. Chew sugar-free gum between meals to cut down on snacking. Avoid grocery shopping when you're hungry. Pack a healthy lunch instead of eating out to control what and how much you eat. Eat a lot of fruits and vegetables. Aim for about 2 cups of fruit and 2 to 3 cups of vegetables per day. Aim for 150 minutes per week of moderate-intensity exercise (such as brisk walking), or 75 minutes per week of vigorous exercise (such as jogging or running). OR 15-30 min of daily brisk walking. Be more active. Small changes in physical activity can easily be added to your daily routine. For example, take the stairs instead of the elevator. Take a walk with your family. A daily walk is a great way to get exercise and to catch up on the day's events.  Please be sure medication list is accurate. If a new problem present, please set up appointment sooner than planned today.

## 2016-05-24 ENCOUNTER — Other Ambulatory Visit (INDEPENDENT_AMBULATORY_CARE_PROVIDER_SITE_OTHER): Payer: 59

## 2016-05-24 DIAGNOSIS — R946 Abnormal results of thyroid function studies: Secondary | ICD-10-CM

## 2016-05-24 DIAGNOSIS — E669 Obesity, unspecified: Secondary | ICD-10-CM | POA: Diagnosis not present

## 2016-05-24 DIAGNOSIS — R232 Flushing: Secondary | ICD-10-CM | POA: Diagnosis not present

## 2016-05-24 DIAGNOSIS — R7989 Other specified abnormal findings of blood chemistry: Secondary | ICD-10-CM

## 2016-05-24 LAB — TSH: TSH: 2.62 u[IU]/mL (ref 0.35–4.50)

## 2016-05-24 LAB — BASIC METABOLIC PANEL
BUN: 13 mg/dL (ref 6–23)
CHLORIDE: 106 meq/L (ref 96–112)
CO2: 23 mEq/L (ref 19–32)
Calcium: 9.3 mg/dL (ref 8.4–10.5)
Creatinine, Ser: 0.88 mg/dL (ref 0.40–1.20)
GFR: 91.69 mL/min (ref >60.00)
Glucose, Bld: 101 mg/dL — ABNORMAL HIGH (ref 70–99)
POTASSIUM: 4.5 meq/L (ref 3.5–5.1)
Sodium: 137 mEq/L (ref 135–145)

## 2016-05-24 LAB — T4, FREE: Free T4: 0.63 ng/dL (ref 0.60–1.60)

## 2016-05-26 ENCOUNTER — Encounter: Payer: Self-pay | Admitting: Family Medicine

## 2016-06-28 ENCOUNTER — Other Ambulatory Visit: Payer: Self-pay | Admitting: Internal Medicine

## 2016-06-29 NOTE — Progress Notes (Signed)
HPI:   [name] is a 41 y.o. female, who is here today for her routine physical. 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  She follows with her gyn regularly, last appt 11/2015. Last CPE 10/2014.  Regular exercise 3 or more time per week: Yes, walking 15 min daily. Following a healthy diet: "Trying", she has not been consistent with a healthy diet. She lives with her sister and son.  Chronic medical problems: Depression, not currently symptomatic.  She has vaccination records with her, last Td/Tdap 07/2006. PPD about 2 years ago.  Other vaccines required, MMR and varicella immune status, are met.  Rash: Today she mentions having pruritic rash on abdomen a day after wearing an back brace to protect her back. Rash is affecting areas that where in contact with brace. Pruritic, denies tenderness. No new body product,detergent,soap,or medication. She already got OTC Hydrocortisone cream , has not started treatment. No associated fever,chills, or myalgias.   She has no other concerns today.   Review of Systems  Constitutional: Negative for appetite change, fatigue, fever and unexpected weight change.  HENT: Negative for dental problem, hearing loss, mouth sores, trouble swallowing and voice change.   Eyes: Negative for redness and visual disturbance.  Respiratory: Negative for cough, shortness of breath and wheezing.   Cardiovascular: Negative for chest pain and leg swelling.  Gastrointestinal: Negative for abdominal pain, blood in stool, nausea and vomiting.       No changes in bowel habits.  Endocrine: Negative for cold intolerance, heat intolerance, polydipsia, polyphagia and polyuria.  Genitourinary: Negative for decreased urine volume, dysuria, hematuria, vaginal bleeding and vaginal discharge.  Musculoskeletal: Negative for gait problem and myalgias.  Skin: Positive for rash. Negative for wound.  Neurological: Negative for  syncope, weakness and headaches.  Hematological: Negative for adenopathy. Does not bruise/bleed easily.  Psychiatric/Behavioral: Negative for confusion, sleep disturbance and suicidal ideas. The patient is not nervous/anxious.   All other systems reviewed and are negative.     Current Outpatient Prescriptions on File Prior to Visit  Medication Sig Dispense Refill  . clotrimazole-betamethasone (LOTRISONE) cream APPLY 1 APPLICATION TOPICALLY 2 (TWO) TIMES DAILY. 45 g 0  . MICROGESTIN FE 1/20 1-20 MG-MCG tablet      No current facility-administered medications on file prior to visit.      Past Medical History:  Diagnosis Date  . Depression   . Heart murmur     No Known Allergies  Family History  Problem Relation Age of Onset  . Hypertension Mother   . Heart murmur Father   . Hypertension Brother   . Diabetes Maternal Grandmother     Social History   Social History  . Marital status: Single    Spouse name: N/A  . Number of children: N/A  . Years of education: N/A   Social History Main Topics  . Smoking status: Never Smoker  . Smokeless tobacco: Never Used  . Alcohol use No  . Drug use: No  . Sexual activity: Yes    Birth control/ protection: Spermicide   Other Topics Concern  . None   Social History Narrative  . None     Vitals:   06/30/16 1557  BP: 124/78  Pulse: 72  Resp: 12  Temp: 97.6 F (36.4 C)   Body mass index is 32.45 kg/m.   Wt Readings from Last 3 Encounters:  06/30/16 195 lb (88.5 kg)  05/19/16 195 lb 6  oz (88.6 kg)  10/28/14 190 lb (86.2 kg)     Physical Exam  Nursing note and vitals reviewed. Constitutional: She is oriented to person, place, and time. She appears well-developed. No distress.  HENT:  Head: Atraumatic.  Right Ear: Hearing, tympanic membrane, external ear and ear canal normal.  Left Ear: Hearing, tympanic membrane, external ear and ear canal normal.  Mouth/Throat: Uvula is midline, oropharynx is clear and moist  and mucous membranes are normal.  Eyes: Conjunctivae and EOM are normal. Pupils are equal, round, and reactive to light.  Neck: No tracheal deviation present. No thyroid mass and no thyromegaly present.  Cardiovascular: Normal rate and regular rhythm.   Murmur (soft SEM RUSB and LUSB) heard. Pulses:      Dorsalis pedis pulses are 2+ on the right side, and 2+ on the left side.  Respiratory: Effort normal and breath sounds normal. No respiratory distress.  GI: Soft. She exhibits no mass. There is no hepatomegaly. There is no tenderness.  Musculoskeletal: She exhibits no edema or tenderness.  No significant deformity or signs of synovitis appreciated.  Lymphadenopathy:    She has no cervical adenopathy.       Right: No supraclavicular adenopathy present.       Left: No supraclavicular adenopathy present.  Neurological: She is alert and oriented to person, place, and time. She has normal strength. No cranial nerve deficit or sensory deficit. Coordination and gait normal.  Reflex Scores:      Bicep reflexes are 2+ on the right side and 2+ on the left side.      Patellar reflexes are 2+ on the right side and 2+ on the left side. Skin: Skin is warm. Rash noted. No ecchymosis noted. Rash is papular. Rash is not vesicular.  Micropapular, mildly erythematous rash on flanks and periumbilical, follows linear,horizontal pattern.No tender,no local heat or indurated area.  Psychiatric: She has a normal mood and affect. Cognition and memory are normal.  Well groomed, good eye contact.    ASSESSMENT AND PLAN:    Diagnoses and all orders for this visit:  Routine physical examination   We discussed the importance of regular physical activity and healthy diet for prevention of chronic illness and/or complications. Preventive guidelines reviewed. Keep appt with gyn. Vaccination updated. Form filled out and signed, returned back to her. Next CPE in 1-2 years.  -     Hemoglobin A1c; Future -      Lipid panel; Future -     Glucose, random; Future -     Tdap vaccine greater than or equal to 7yo IM  Diabetes mellitus screening -     Hemoglobin A1c; Future -     Glucose, random; Future  Lipid screening -     Lipid panel; Future  Screening-pulmonary TB -     Quantiferon tb gold assay (blood); Future  Contact dermatitis, unspecified contact dermatitis type, unspecified trigger  Hydrocortisone 1% tid as needed for up to 14 days, avoid the identified trigger factor. We discussed some side effects of topical steroid. F/U as needed.  She is coming in 1-2 week for fasting labs.   Return in about 1 year (around 06/30/2017) for routine.          Betty G. Martinique, MD  [facility]. [place] office.

## 2016-06-30 ENCOUNTER — Encounter: Payer: Self-pay | Admitting: Family Medicine

## 2016-06-30 ENCOUNTER — Ambulatory Visit (INDEPENDENT_AMBULATORY_CARE_PROVIDER_SITE_OTHER): Payer: 59 | Admitting: Family Medicine

## 2016-06-30 VITALS — BP 124/78 | HR 72 | Temp 97.6°F | Resp 12 | Ht 65.0 in | Wt 195.0 lb

## 2016-06-30 DIAGNOSIS — Z131 Encounter for screening for diabetes mellitus: Secondary | ICD-10-CM

## 2016-06-30 DIAGNOSIS — Z0000 Encounter for general adult medical examination without abnormal findings: Secondary | ICD-10-CM | POA: Diagnosis not present

## 2016-06-30 DIAGNOSIS — Z111 Encounter for screening for respiratory tuberculosis: Secondary | ICD-10-CM

## 2016-06-30 DIAGNOSIS — Z13220 Encounter for screening for lipoid disorders: Secondary | ICD-10-CM

## 2016-06-30 DIAGNOSIS — Z23 Encounter for immunization: Secondary | ICD-10-CM

## 2016-06-30 DIAGNOSIS — L259 Unspecified contact dermatitis, unspecified cause: Secondary | ICD-10-CM

## 2016-06-30 NOTE — Patient Instructions (Addendum)
A few things to remember from today's visit:   Routine physical examination - Plan: Hemoglobin A1c, Lipid panel, Glucose, random  Diabetes mellitus screening - Plan: Hemoglobin A1c, Glucose, random  Lipid screening - Plan: Lipid panel  Screening-pulmonary TB - Plan: Quantiferon tb gold assay (blood)  Contact dermatitis, unspecified contact dermatitis type, unspecified trigger  You can use Hydrocortisone over the counter for 14 days 2-3 times per day.    At least 150 minutes of moderate exercise per week, daily brisk walking for 15-30 min is a good exercise option. Healthy diet low in saturated (animal) fats and sweets and consisting of fresh fruits and vegetables, lean meats such as fish and white chicken and whole grains.   - Vaccines:  Tdap vaccine every 10 years.  Shingles vaccine recommended at age 27, could be given after 40 years of age but not sure about insurance coverage.  Pneumonia vaccines:  Prevnar 13 at 65 and Pneumovax at 20.  Screening recommendations for low/normal risk women:  Screening for diabetes at age 15-45 and every 3 years.  Cervical cancer prevention:  -HPV vaccination between 12-34 years old. -Pap smear starts at 40 years of age and continues periodically until 40 years old in low risk women. Pap smear every 3 years between 66 and 34 years old. Pap smear every 3 years between women 59 and older if pap smear negative and HPV screening negative.   -Breast cancer: Mammogram: There is disagreement between experts about when to start screening in low risk asymptomatic female but recent recommendations are to start screening at 7 and not later than 40 years old , every 1-2 years and after 40 yo q 2 years. Screening is recommended until 40 years old but some women can continue screening depending of healthy issues.   Colon cancer screening: starts at 40 years old until 40 years old.  Cholesterol disorder screening at age 83 and every 3 years.  Also  recommended:  1. Dental visit- Brush and floss your teeth twice daily; visit your dentist twice a year. 2. Eye doctor- Get an eye exam at least every 2 years. 3. Helmet use- Always wear a helmet when riding a bicycle, motorcycle, rollerblading or skateboarding. 4. Safe sex- If you may be exposed to sexually transmitted infections, use a condom. 5. Seat belts- Seat belts can save your live; always wear one. 6. Smoke/Carbon Monoxide detectors- These detectors need to be installed on the appropriate level of your home. Replace batteries at least once a year. 7. Skin cancer- When out in the sun please cover up and use sunscreen 15 SPF or higher. 8. Violence- If anyone is threatening or hurting you, please tell your healthcare provider.  9. Drink alcohol in moderation- Limit alcohol intake to one drink or less per day. Never drink and drive.   Please be sure medication list is accurate. If a new problem present, please set up appointment sooner than planned today.

## 2016-07-11 ENCOUNTER — Telehealth: Payer: Self-pay | Admitting: Family Medicine

## 2016-07-11 NOTE — Telephone Encounter (Signed)
[facility] - Client [place] [facility]  Patient Name: Natalie Gilbert  DOB: [birth year]    Initial Comment Caller states she needs to know if she should get the Vericella shot if her medical records say she is immune.    Nurse Assessment  Nurse: Arthor Captain, RN, Margaret Date/Time (Eastern Time): 07/11/2016 4:33:55 PM  Confirm and document reason for call. If symptomatic, describe symptoms. ---Caller states that she wants to know if she should get the varicella shot her medical record states that she is immune she had the vaccine in 2008. Does not want triaged. Instructed that if she has had the shots she shouldnt need them again.  Does the patient have any new or worsening symptoms? ---No     Guidelines    Guideline Title Affirmed Question Affirmed Notes       Final Disposition User   Clinical Call McGregor, RN, Joycelyn Schmid

## 2016-07-11 NOTE — Telephone Encounter (Signed)
Patient Name: Natalie Gilbert DOB: [birth year] Initial Comment Caller states she needs to know if she should get Vericella titer if her medical record says she is immune. Nurse Assessment Nurse: Sherrell Puller, RN, Amy Date/Time Eilene Ghazi Time): 07/11/2016 1:51:02 PM Confirm and document reason for call. If symptomatic, describe symptoms. ---Unable to reach patient with 3 attempts. Does the patient have any new or worsening symptoms? ---No Guidelines Guideline Title Affirmed Question Affirmed Notes Final Disposition User FINAL ATTEMPT MADE - no message left Sherrell Puller, RN, Amy

## 2016-07-11 NOTE — Telephone Encounter (Signed)
Attempted to return call to patient; no answer; message left for return call.

## 2016-07-12 NOTE — Telephone Encounter (Signed)
LMTCB If pt has either had chicken pox or the vaccine she has positive immunity and would not need another vaccine.

## 2016-07-13 NOTE — Telephone Encounter (Signed)
Spoke with pt and advised. Nothing further needed at this time.  ° °

## 2016-07-15 ENCOUNTER — Encounter: Payer: 59 | Admitting: Family Medicine

## 2016-07-15 ENCOUNTER — Other Ambulatory Visit (INDEPENDENT_AMBULATORY_CARE_PROVIDER_SITE_OTHER): Payer: 59

## 2016-07-15 DIAGNOSIS — Z131 Encounter for screening for diabetes mellitus: Secondary | ICD-10-CM

## 2016-07-15 DIAGNOSIS — R7989 Other specified abnormal findings of blood chemistry: Secondary | ICD-10-CM

## 2016-07-15 DIAGNOSIS — Z13220 Encounter for screening for lipoid disorders: Secondary | ICD-10-CM | POA: Diagnosis not present

## 2016-07-15 DIAGNOSIS — Z0000 Encounter for general adult medical examination without abnormal findings: Secondary | ICD-10-CM

## 2016-07-15 DIAGNOSIS — Z111 Encounter for screening for respiratory tuberculosis: Secondary | ICD-10-CM | POA: Diagnosis not present

## 2016-07-15 LAB — LDL CHOLESTEROL, DIRECT: Direct LDL: 87 mg/dL

## 2016-07-15 LAB — LIPID PANEL
Cholesterol: 172 mg/dL (ref 0–200)
HDL: 40.9 mg/dL (ref >39.00)
NonHDL: 131.28
TRIGLYCERIDES: 299 mg/dL — AB (ref 0.0–149.0)
Total CHOL/HDL Ratio: 4
VLDL: 59.8 mg/dL — ABNORMAL HIGH (ref 0.0–40.0)

## 2016-07-15 LAB — HEMOGLOBIN A1C: Hgb A1c MFr Bld: 5.6 % (ref 4.6–6.5)

## 2016-07-15 LAB — GLUCOSE, RANDOM: GLUCOSE: 92 mg/dL (ref 70–99)

## 2016-07-19 ENCOUNTER — Encounter: Payer: Self-pay | Admitting: Family Medicine

## 2016-07-19 LAB — QUANTIFERON TB GOLD ASSAY (BLOOD)
Interferon Gamma Release Assay: NEGATIVE
MITOGEN-NIL SO: 6.1 [IU]/mL
QUANTIFERON TB AG MINUS NIL: 0 [IU]/mL
Quantiferon Nil Value: 0.03 IU/mL

## 2016-07-21 ENCOUNTER — Encounter: Payer: Self-pay | Admitting: Family Medicine

## 2016-11-10 ENCOUNTER — Encounter: Payer: Self-pay | Admitting: Family Medicine

## 2017-05-01 DIAGNOSIS — R102 Pelvic and perineal pain: Secondary | ICD-10-CM | POA: Diagnosis not present

## 2017-05-01 DIAGNOSIS — Z01419 Encounter for gynecological examination (general) (routine) without abnormal findings: Secondary | ICD-10-CM | POA: Diagnosis not present

## 2017-05-01 DIAGNOSIS — Z130 Encounter for screening for diseases of the blood and blood-forming organs and certain disorders involving the immune mechanism: Secondary | ICD-10-CM | POA: Diagnosis not present

## 2017-05-01 DIAGNOSIS — N9412 Deep dyspareunia: Secondary | ICD-10-CM | POA: Diagnosis not present

## 2017-05-01 DIAGNOSIS — Z6829 Body mass index (BMI) 29.0-29.9, adult: Secondary | ICD-10-CM | POA: Diagnosis not present

## 2017-05-01 DIAGNOSIS — Z113 Encounter for screening for infections with a predominantly sexual mode of transmission: Secondary | ICD-10-CM | POA: Diagnosis not present

## 2017-05-01 DIAGNOSIS — Z3041 Encounter for surveillance of contraceptive pills: Secondary | ICD-10-CM | POA: Diagnosis not present

## 2017-05-01 DIAGNOSIS — Z1389 Encounter for screening for other disorder: Secondary | ICD-10-CM | POA: Diagnosis not present

## 2017-05-01 DIAGNOSIS — Z1231 Encounter for screening mammogram for malignant neoplasm of breast: Secondary | ICD-10-CM | POA: Diagnosis not present

## 2017-05-04 ENCOUNTER — Other Ambulatory Visit: Payer: Self-pay | Admitting: Obstetrics and Gynecology

## 2017-05-04 DIAGNOSIS — R928 Other abnormal and inconclusive findings on diagnostic imaging of breast: Secondary | ICD-10-CM

## 2017-05-09 ENCOUNTER — Ambulatory Visit
Admission: RE | Admit: 2017-05-09 | Discharge: 2017-05-09 | Disposition: A | Discharge status: Home / Self Care | Payer: 59 | Source: Ambulatory Visit | Attending: Obstetrics and Gynecology | Admitting: Obstetrics and Gynecology

## 2017-05-09 ENCOUNTER — Ambulatory Visit: Payer: 59

## 2017-05-09 DIAGNOSIS — R928 Other abnormal and inconclusive findings on diagnostic imaging of breast: Secondary | ICD-10-CM | POA: Diagnosis not present

## 2017-05-14 DIAGNOSIS — E781 Pure hyperglyceridemia: Secondary | ICD-10-CM | POA: Insufficient documentation

## 2017-05-14 DIAGNOSIS — E785 Hyperlipidemia, unspecified: Secondary | ICD-10-CM | POA: Insufficient documentation

## 2017-05-14 NOTE — Progress Notes (Signed)
HPI:   Natalie Gilbert is a 42 y.o. female, who is here today for her routine physical, which she scheduled without realizing that he has not been quite a year since her last one.  She states that she is not sure if her insurance will pay for another physical at this time but she had all the reasons for which she is scheduled this appointment.  Last CPE: 06/2016 She follows with gyn for her female preventive care, last visit 1-2 weeks ago. She states that she is "not feeling well and wanted to be sure everything was fine." When asked about specific concerns she mentions that she has missed her menstrua period in 03/2017, having had allergies, "always" feeling had, occasionally nauseated, "little dizzy sometimes", and RLQ abdominal pain.  LMP 03/07/17. She denies history of irregular menstrual cycles. In the past she has had mildly abnormal TSH.  Lab Results  Component Value Date   TSH 2.62 05/24/2016    According to patient, she mentioned some of these concerns to her gynecologist, she was reassured.  Initially she states that she has had RLQ abdominal pain since 03/2017, started when she was supposed to have her menstrual period.  She is not sure about frequency, she thinks she might have it twice per week, usually at night. She also mentions right-sided abdominal pain that started a few days ago when she was bending forward to reach for something in her car.  She is not sure if these 2 pains are related and when providing details she alternates between both.  She is not sure about exacerbating or alleviating factors, she feels like pain is worse when she lays on her right side in bed. Pain does not bother her during the day. Most of the time pain is not severe but a couple times pain has been "excruciating."   She denies urinary symptoms but mentions that she usually has microscopic blood in the urine.  She denies abnormal vaginal discharge. She is on OCPs. She has had 2  pregnancy tests at home, both negative.  She is also complaining of changes in bowel habits, states that she is no longer having daily bowel movements, not sure about how frequent she is having them now.  Last bowel movement today.  She denies blood or mucus in the stool.  She denies depressed mood or anxiety. She has past history of depression, she attributes symptoms to relationship issues she had back in 01/2017. She denies suicidal thoughts.   HLD: She is on nonpharmacologic treatment.   Lab Results  Component Value Date   CHOL 172 07/15/2016   HDL 40.90 07/15/2016   LDLDIRECT 87.0 07/15/2016   TRIG 299.0 (H) 07/15/2016   CHOLHDL 4 07/15/2016   She has not been consistent with a healthy diet and is not exercising regularly.  She has noted some weight loss but she attributed to changing from a sedentary job to a more active one, she walks all day    Review of Systems  Constitutional: Positive for fatigue. Negative for activity change, appetite change and fever.  HENT: Negative for mouth sores, nosebleeds, sore throat and trouble swallowing.   Eyes: Negative for redness and visual disturbance.  Respiratory: Negative for cough, shortness of breath and wheezing.   Cardiovascular: Negative for chest pain, palpitations and leg swelling.  Gastrointestinal: Positive for abdominal pain, constipation and nausea. Negative for abdominal distention, blood in stool and vomiting.  Endocrine: Negative for cold intolerance, heat intolerance, polydipsia,  polyphagia and polyuria.  Genitourinary: Positive for menstrual problem and pelvic pain. Negative for decreased urine volume, dysuria, hematuria, vaginal bleeding and vaginal discharge.  Musculoskeletal: Positive for myalgias. Negative for gait problem.  Skin: Negative for rash and wound.  Neurological: Negative for syncope, weakness, numbness and headaches.  Hematological: Negative for adenopathy. Does not bruise/bleed easily.    Psychiatric/Behavioral: Negative for confusion. The patient is not nervous/anxious.       Current Outpatient Medications on File Prior to Visit  Medication Sig Dispense Refill  . clotrimazole-betamethasone (LOTRISONE) cream APPLY 1 APPLICATION TOPICALLY 2 (TWO) TIMES DAILY. 45 g 0  . MICROGESTIN FE 1/20 1-20 MG-MCG tablet      No current facility-administered medications on file prior to visit.      Past Medical History:  Diagnosis Date  . Depression   . Heart murmur     History reviewed. No pertinent surgical history.  No Known Allergies  Family History  Problem Relation Age of Onset  . Hypertension Mother   . Heart murmur Father   . Hypertension Brother   . Diabetes Maternal Grandmother   . Breast cancer Paternal Grandmother     Social History   Socioeconomic History  . Marital status: Single    Spouse name: Not on file  . Number of children: Not on file  . Years of education: Not on file  . Highest education level: Not on file  Occupational History  . Not on file  Social Needs  . Financial resource strain: Not on file  . Food insecurity:    Worry: Not on file    Inability: Not on file  . Transportation needs:    Medical: Not on file    Non-medical: Not on file  Tobacco Use  . Smoking status: Never Smoker  . Smokeless tobacco: Never Used  Substance and Sexual Activity  . Alcohol use: No  . Drug use: No  . Sexual activity: Yes    Birth control/protection: Spermicide  Lifestyle  . Physical activity:    Days per week: Not on file    Minutes per session: Not on file  . Stress: Not on file  Relationships  . Social connections:    Talks on phone: Not on file    Gets together: Not on file    Attends religious service: Not on file    Active member of club or organization: Not on file    Attends meetings of clubs or organizations: Not on file    Relationship status: Not on file  Other Topics Concern  . Not on file  Social History Narrative  . Not  on file    Vitals:   05/15/17 1154  BP: 134/88  Pulse: 86  Resp: 12  Temp: 98.8 F (37.1 C)  SpO2: 96%   Body mass index is 29.37 kg/m.   Wt Readings from Last 3 Encounters:  05/15/17 176 lb 8 oz (80.1 kg)  06/30/16 195 lb (88.5 kg)  05/19/16 195 lb 6 oz (88.6 kg)    Physical Exam  Nursing note and vitals reviewed. Constitutional: She is oriented to person, place, and time. She appears well-developed. No distress.  HENT:  Head: Normocephalic and atraumatic.  Mouth/Throat: Oropharynx is clear and moist and mucous membranes are normal.  Eyes: Pupils are equal, round, and reactive to light. Conjunctivae are normal.  Neck: No tracheal deviation present. No thyroid mass and no thyromegaly present.  Cardiovascular: Normal rate and regular rhythm.  No murmur heard. Pulses:  Dorsalis pedis pulses are 2+ on the right side, and 2+ on the left side.  Respiratory: Effort normal and breath sounds normal. No respiratory distress.  GI: Soft. She exhibits no mass. There is no hepatomegaly. There is tenderness in the suprapubic area. There is no rigidity, no rebound and no guarding. No hernia.    Tenderness upon palpation of right waist area.    Musculoskeletal: She exhibits no edema.       Right hip: She exhibits normal range of motion, no tenderness and no bony tenderness.       Lumbar back: She exhibits no tenderness and no bony tenderness.  Lymphadenopathy:    She has no cervical adenopathy.       Right: No inguinal adenopathy present.       Left: No inguinal adenopathy present.  Neurological: She is alert and oriented to person, place, and time. She has normal strength. Gait normal.  Skin: Skin is warm. No rash noted. No erythema.  Psychiatric: Her mood appears anxious.  Well groomed, good eye contact.      ASSESSMENT AND PLAN:  Natalie Gilbert was here today to address some concerns.   Orders Placed This Encounter  Procedures  . US Pelvis Complete  . US  Transvaginal Non-OB  . Basic metabolic panel  . TSH  . Urinalysis, Routine w reflex microscopic  . POCT urine pregnancy    RLQ abdominal pain  We discussed possible etiologies. It seems like she has 2 type of problems at this time: Pain on right side of waist seems to be muscle related, reproducible today.  Explained that she could have pulled a muscle.  RLQ pain she is reporting is not reproducible today,?  Ovulation, follicular ovarian cyst, constipation and some to be considered. Recommend increasing fiber and fluid intake.   She is reporting recent Pap smear and pelvic examination, negative overall. She would like to have a pelvic ultrasound.  She did have pain upon palpation of suprapubic area.  Follow-up will be arranged depending of lab imaging/ results.  -     Basic metabolic panel -     Urinalysis, Routine w reflex microscopic -     US Pelvis Complete; Future -     US Transvaginal Non-OB; Future  Amenorrhea, secondary  Explained that is not unusual for females to me is 1-2 menstrual periods in a year. ? Perimenopausal,thyroid disease among some to consider. She is on OCP's, reporting 2 home pregnancy test negative but she still wants one done today.  -     TSH -     POCT urine pregnancy   Nausea without vomiting  It seems to be mild and not very frequent, so for now I am not recommending pharmacologic treatment. We discussed possible etiologies, including GI, anxiety and once home. We will continue monitoring. Instructed about warning signs.    Hypertriglyceridemia She is not fasting today. She will continue nonpharmacologic treatment for now. We will plan on checking lipid panel next visit.       Isac Lincks G. Martinique, MD  [facility]. [place] office.

## 2017-05-15 ENCOUNTER — Ambulatory Visit (INDEPENDENT_AMBULATORY_CARE_PROVIDER_SITE_OTHER): Payer: 59 | Admitting: Family Medicine

## 2017-05-15 ENCOUNTER — Encounter: Payer: Self-pay | Admitting: Family Medicine

## 2017-05-15 VITALS — BP 134/88 | HR 86 | Temp 98.8°F | Resp 12 | Ht 65.0 in | Wt 176.5 lb

## 2017-05-15 DIAGNOSIS — R110 Nausea: Secondary | ICD-10-CM | POA: Diagnosis not present

## 2017-05-15 DIAGNOSIS — Z131 Encounter for screening for diabetes mellitus: Secondary | ICD-10-CM

## 2017-05-15 DIAGNOSIS — R1031 Right lower quadrant pain: Secondary | ICD-10-CM

## 2017-05-15 DIAGNOSIS — E781 Pure hyperglyceridemia: Secondary | ICD-10-CM

## 2017-05-15 DIAGNOSIS — N911 Secondary amenorrhea: Secondary | ICD-10-CM | POA: Diagnosis not present

## 2017-05-15 LAB — BASIC METABOLIC PANEL
BUN: 11 mg/dL (ref 6–23)
CALCIUM: 9.8 mg/dL (ref 8.4–10.5)
CHLORIDE: 103 meq/L (ref 96–112)
CO2: 29 meq/L (ref 19–32)
Creatinine, Ser: 0.79 mg/dL (ref 0.40–1.20)
GFR: 103.34 mL/min (ref >60.00)
Glucose, Bld: 80 mg/dL (ref 70–99)
POTASSIUM: 4.5 meq/L (ref 3.5–5.1)
SODIUM: 138 meq/L (ref 135–145)

## 2017-05-15 LAB — URINALYSIS, ROUTINE W REFLEX MICROSCOPIC
BILIRUBIN URINE: NEGATIVE
HGB URINE DIPSTICK: NEGATIVE
KETONES UR: NEGATIVE
LEUKOCYTES UA: NEGATIVE
NITRITE: NEGATIVE
RBC / HPF: NONE SEEN (ref 0–?)
SPECIFIC GRAVITY, URINE: 1.02 (ref 1.000–1.030)
Total Protein, Urine: NEGATIVE
URINE GLUCOSE: NEGATIVE
UROBILINOGEN UA: 0.2 (ref 0.0–1.0)
pH: 8 (ref 5.0–8.0)

## 2017-05-15 LAB — TSH: TSH: 0.83 u[IU]/mL (ref 0.35–4.50)

## 2017-05-15 LAB — POCT URINE PREGNANCY: PREG TEST UR: NEGATIVE

## 2017-05-15 NOTE — Assessment & Plan Note (Signed)
She is not fasting today. She will continue nonpharmacologic treatment for now. We will plan on checking lipid panel next visit.

## 2017-05-15 NOTE — Patient Instructions (Addendum)
A few things to remember from today's visit:   Diabetes mellitus screening  Hypertriglyceridemia  RLQ abdominal pain - Plan: Basic metabolic panel, Urinalysis, Routine w reflex microscopic, US Pelvis Complete, US Transvaginal Non-OB  Amenorrhea, secondary - Plan: TSH, POCT urine pregnancy    Please be sure medication list is accurate. If a new problem present, please set up appointment sooner than planned today.

## 2017-05-18 DIAGNOSIS — L249 Irritant contact dermatitis, unspecified cause: Secondary | ICD-10-CM | POA: Diagnosis not present

## 2017-05-22 ENCOUNTER — Ambulatory Visit
Admission: RE | Admit: 2017-05-22 | Discharge: 2017-05-22 | Disposition: A | Discharge status: Home / Self Care | Payer: 59 | Source: Ambulatory Visit | Attending: Family Medicine | Admitting: Family Medicine

## 2017-05-22 DIAGNOSIS — R1031 Right lower quadrant pain: Secondary | ICD-10-CM

## 2017-05-22 DIAGNOSIS — D259 Leiomyoma of uterus, unspecified: Secondary | ICD-10-CM | POA: Diagnosis not present

## 2017-05-25 ENCOUNTER — Encounter: Payer: Self-pay | Admitting: Family Medicine

## 2017-06-19 DIAGNOSIS — R102 Pelvic and perineal pain: Secondary | ICD-10-CM | POA: Diagnosis not present

## 2017-06-19 DIAGNOSIS — D251 Intramural leiomyoma of uterus: Secondary | ICD-10-CM | POA: Diagnosis not present

## 2017-07-31 ENCOUNTER — Ambulatory Visit (INDEPENDENT_AMBULATORY_CARE_PROVIDER_SITE_OTHER): Payer: 59 | Admitting: Family Medicine

## 2017-07-31 ENCOUNTER — Encounter: Payer: Self-pay | Admitting: Family Medicine

## 2017-07-31 DIAGNOSIS — L301 Dyshidrosis [pompholyx]: Secondary | ICD-10-CM

## 2017-07-31 DIAGNOSIS — J3089 Other allergic rhinitis: Secondary | ICD-10-CM | POA: Insufficient documentation

## 2017-07-31 DIAGNOSIS — J301 Allergic rhinitis due to pollen: Secondary | ICD-10-CM

## 2017-07-31 DIAGNOSIS — J309 Allergic rhinitis, unspecified: Secondary | ICD-10-CM

## 2017-07-31 MED ORDER — FLUTICASONE PROPIONATE 50 MCG/ACT NA SUSP: 1.0000 | Freq: Two times a day (BID) | NASAL | 3 refills | Status: DC

## 2017-07-31 MED ORDER — CLOBETASOL PROPIONATE 0.05 % EX CREA: 1.0000 "application " | TOPICAL_CREAM | Freq: Two times a day (BID) | CUTANEOUS | 1 refills | Status: DC

## 2017-07-31 MED ORDER — PREDNISONE 20 MG PO TABS: 40.0000 mg | ORAL_TABLET | Freq: Every day | ORAL | 0 refills | Status: AC

## 2017-07-31 NOTE — Patient Instructions (Signed)
A few things to remember from today's visit:   Dyshidrotic eczema - Plan: predniSONE (DELTASONE) 20 MG tablet, clobetasol cream (TEMOVATE) 0.05 %  Seasonal allergic rhinitis due to pollen - Plan: predniSONE (DELTASONE) 20 MG tablet, fluticasone (FLONASE) 50 MCG/ACT nasal spray   Dyshidrotic Eczema Dyshidrotic eczema (pompholyx) is a type of eczema that causes very itchy (pruritic), fluid-filled blisters (vesicles) to form on the hands and feet. It can affect people of any age, but is more common before the age of 32. There is no cure, but treatment and certain lifestyle changes can help relieve symptoms. What are the causes? The cause of this condition is not known. What increases the risk? You are more likely to develop this condition if:  You wash your hands frequently.  You have a personal history or family history of eczema, allergies, asthma, or hay fever.  You are allergic to metals such as nickel or cobalt.  You work with cement.  You smoke.  What are the signs or symptoms? Symptoms of this condition may affect the hands, feet, or both. Symptoms may come and go (recur), and may include:  Severe itching, which may happen before blisters appear.  Blisters. These may form suddenly. ? In the early stages, blisters may form near the fingertips. ? In severe cases, blisters may grow to large blister masses (bullae). ? Blisters resolve in 2-3 weeks without bursting. This is followed by a dry phase in which itching eases.  Pain and swelling.  Cracks or long, narrow openings (fissures) in the skin.  Severe dryness.  Ridges on the nails.  How is this diagnosed? This condition may be diagnosed based on:  A physical exam.  Your symptoms.  Your medical history.  Skin scrapings to rule out a fungal infection.  Testing a swab of fluid for bacteria (culture).  Removing and checking a small piece of skin (biopsy) in order to test for infection or to rule out other  conditions.  Skin patch tests. These tests involve taking patches that contain possible allergens and placing them on your back. Your health care provider will wait a few days and then check to see if an allergic reaction occurred. These tests may be done if your health care provider suspects allergic reactions, or to rule out other types of eczema.  You may be referred to a health care provider who specializes in the skin (dermatologist) to help diagnose and treat this condition. How is this treated? There is no cure for this condition, but treatment can help relieve symptoms. Depending on how many blisters you have and how severe they are, your health care provider may suggest:  Avoiding allergens, irritants, or triggers that worsen symptoms. This may involve lifestyle changes such as: ? Using different lotions or soaps. ? Avoiding hot weather or places that will cause you to sweat a lot. ? Managing stress with coping techniques such as relaxation and exercise, and asking for help when you need it. ? Diet changes as recommended by your health care provider.  Using a clean, damp towel (cool compress) to relieve symptoms.  Soaking in a bath that contains a type of salt that relieves irritation (aluminum acetate soaks).  Medicine taken by mouth to reduce itching (oral antihistamines).  Medicine applied to the skin to reduce swelling and irritation (topical corticosteroids).  Medicine that reduces the activity of the body's disease-fighting system (immunosuppressants) to treat inflammation. This may be given in severe cases.  Antibiotic medicines to treat bacterial infection.  Light therapy (phototherapy). This involves shining ultraviolet (UV) light on affected skin in order to reduce itchiness and inflammation.  Follow these instructions at home: Bathing and skin care  Wash skin gently. After bathing or washing your hands, pat your skin dry. Avoid rubbing your skin.  Remove all  jewelry before bathing. If the skin under the jewelry stays wet, blisters may form or get worse.  Apply cool compresses as told by your health care provider: ? Soak a clean towel in cool water. ? Wring out excess water until towel is damp. ? Place the towel over affected skin. Leave the towel on for 20 minutes at a time, 2-3 times a day.  Use mild soaps, cleansers, and lotions that do not contain dyes, perfumes, or other irritants.  Keep your skin hydrated. To do this: ? Avoid very hot water. Take lukewarm baths or showers. ? Apply moisturizer within three minutes of bathing. This locks in moisture. Medicines  Take and apply over-the-counter and prescription medicines only as told by your health care provider.  If you were prescribed antibiotic medicine, take or apply it as told by your health care provider. Do not stop using the antibiotic even if you start to feel better. General instructions  Identify and avoid triggers and allergens.  Keep fingernails short to avoid breaking open the skin while scratching.  Use waterproof gloves to protect your hands when doing work that keeps your hands wet for a long time.  Wear socks to keep your feet dry.  Do not use any products that contain nicotine or tobacco, such as cigarettes and e-cigarettes. If you need help quitting, ask your health care provider.  Keep all follow-up visits as told by your health care provider. This is important. Contact a health care provider if:  You have symptoms that do not go away.  You have signs of infection, such as: ? Crusting, pus, or a bad smell. ? More redness, swelling, or pain. ? Increased warmth in the affected area. Summary  Dyshidrotic eczema (pompholyx) is a type of eczema that causes very itchy (pruritic), fluid-filled blisters (vesicles) to form on the hands and feet.  The cause of this condition is not known.  There is no cure for this condition, but treatment can help relieve  symptoms. Treatment depends on how many blisters you have and how severe they are.  Use mild soaps, cleansers, and lotions that do not contain dyes, perfumes, or other irritants. Keep your skin hydrated. This information is not intended to replace advice given to you by your health care provider. Make sure you discuss any questions you have with your health care provider. Document Released: 06/23/2016 Document Revised: 06/23/2016 Document Reviewed: 06/23/2016 Elsevier Interactive Patient Education  2018 [employer].  Please be sure medication list is accurate. If a new problem present, please set up appointment sooner than planned today.

## 2017-07-31 NOTE — Progress Notes (Addendum)
ACUTE VISIT   HPI:  Chief Complaint  Patient presents with  . Rash    dermatitis    [name] is a 42 y.o. female, who is here today complaining of very pruritic rash on hands, which she has had since 11/2016,constant.  According to patient, she was evaluated by dermatologist in 04/2017 and topical fluocinolone ointment was recommended.  She has not noted relief.  She tried to schedule follow-up appointment but her dermatologist did not have one available for this week.  Rash seems to be exacerbated by gel hand sanitizer. She has history of eczema and allergic rhinitis. She wonders if she can take something by mouth to relieve the pruritus, it is interfering with her sleep.  Problem is otherwise stable. If she plays hands under warm water she has temporal relief of pruritus.  No new medication, detergent, or soap. No known insect bite or outdoor exposure.  Having nasal congestion and rhinorrhea as well as nose pruritus. She takes OTC Benadryl as needed. She has not noted fever, chills, cough, or wheezing.   Symptoms are exacerbated by weather changes.  Review of Systems  Constitutional: Negative for appetite change, chills, fatigue and fever.  HENT: Positive for congestion, postnasal drip and rhinorrhea. Negative for ear pain, mouth sores, nosebleeds, sneezing, sore throat, trouble swallowing and voice change.   Eyes: Negative for discharge and redness.  Respiratory: Negative for cough, shortness of breath and wheezing.   Cardiovascular: Negative for chest pain, palpitations and leg swelling.  Gastrointestinal: Negative for abdominal pain, diarrhea, nausea and vomiting.  Musculoskeletal: Negative for arthralgias, joint swelling and myalgias.  Skin: Positive for rash. Negative for wound.  Allergic/Immunologic: Positive for environmental allergies.  Neurological: Negative for weakness and numbness.  Psychiatric/Behavioral: Positive for sleep disturbance. The  patient is nervous/anxious.       Current Outpatient Medications on File Prior to Visit  Medication Sig Dispense Refill  . clotrimazole-betamethasone (LOTRISONE) cream APPLY 1 APPLICATION TOPICALLY 2 (TWO) TIMES DAILY. 45 g 0  . fluocinonide ointment (LIDEX) 0.05 % fluocinonide 0.05 % topical ointment  APPLY TO AFFECTED AREA OF HANDS TWICE A DAY FOR 14 DAYS    . MICROGESTIN FE 1/20 1-20 MG-MCG tablet      No current facility-administered medications on file prior to visit.      Past Medical History:  Diagnosis Date  . Depression   . Heart murmur    No Known Allergies  Social History   Socioeconomic History  . Marital status: Single    Spouse name: Not on file  . Number of children: Not on file  . Years of education: Not on file  . Highest education level: Not on file  Occupational History  . Not on file  Social Needs  . Financial resource strain: Not on file  . Food insecurity:    Worry: Not on file    Inability: Not on file  . Transportation needs:    Medical: Not on file    Non-medical: Not on file  Tobacco Use  . Smoking status: Never Smoker  . Smokeless tobacco: Never Used  Substance and Sexual Activity  . Alcohol use: No  . Drug use: No  . Sexual activity: Yes    Birth control/protection: Spermicide  Lifestyle  . Physical activity:    Days per week: Not on file    Minutes per session: Not on file  . Stress: Not on file  Relationships  . Social connections:  Talks on phone: Not on file    Gets together: Not on file    Attends religious service: Not on file    Active member of club or organization: Not on file    Attends meetings of clubs or organizations: Not on file    Relationship status: Not on file  Other Topics Concern  . Not on file  Social History Narrative  . Not on file    Vitals:   07/31/17 1512  BP: 124/76  Pulse: 76  Resp: 12  Temp: 98.4 F (36.9 C)  SpO2: 96%   Body mass index is 29.64 kg/m.   Physical Exam  Nursing  note and vitals reviewed. Constitutional: She is oriented to person, place, and time. She appears well-developed. No distress.  HENT:  Head: Normocephalic and atraumatic.  Nose: Rhinorrhea present.  Mouth/Throat: Oropharynx is clear and moist and mucous membranes are normal.  Nasal voice. Hypertrophic turbinates.   Eyes: Conjunctivae are normal.  Cardiovascular: Normal rate and regular rhythm.  No murmur heard. Respiratory: Effort normal and breath sounds normal. No respiratory distress.  Musculoskeletal: She exhibits no edema or tenderness.  Lymphadenopathy:    She has no cervical adenopathy.  Neurological: She is alert and oriented to person, place, and time. She has normal strength. Gait normal.  Skin: Skin is warm. Rash noted.  Microvesicular, non-erythematosus rash on fingers and dorsum of hands. A few lesions scattered on foot arch, bilateral. Rash is not tender,no local heat,or induration.   Psychiatric: Her mood appears anxious.  Well groomed, good eye contact.     ASSESSMENT AND PLAN:  Natalie Gilbert was seen today for rash.  Diagnoses and all orders for this visit:  Dyshidrotic eczema  Educated about diagnosis, including prognosis. Discontinue Fluocinolone ointment. Side effects of topical steroids discussed, she will try Clobetasol cream twice daily for up to 14 days at a time. Try to avoid trigger factors. If not better, she needs to follow with her dermatologist.  -     predniSONE (DELTASONE) 20 MG tablet; Take 2 tablets (40 mg total) by mouth daily with breakfast for 5 days. -     clobetasol cream (TEMOVATE) 0.05 %; Apply 1 application topically 2 (two) times daily. Daily for up to 14 days.  Seasonal allergic rhinitis due to pollen  He is not well controlled. Recommend intranasal Flonase spray on OTC Zyrtec 10 mg daily. Nasal irrigations with saline as needed. We could not Singulair 10 mg if symptoms still not well controlled with recommended  treatment.  -     predniSONE (DELTASONE) 20 MG tablet; Take 2 tablets (40 mg total) by mouth daily with breakfast for 5 days. -     fluticasone (FLONASE) 50 MCG/ACT nasal spray; Place 1 spray into both nostrils 2 (two) times daily.    Return if symptoms worsen or fail to improve.    Merland Holness G. Martinique, MD  [facility]. [place] office.

## 2017-11-22 ENCOUNTER — Encounter: Payer: Self-pay | Admitting: Family Medicine

## 2017-11-27 ENCOUNTER — Ambulatory Visit: Payer: 59 | Admitting: Family Medicine

## 2018-03-31 ENCOUNTER — Ambulatory Visit (INDEPENDENT_AMBULATORY_CARE_PROVIDER_SITE_OTHER): Payer: 59 | Admitting: Family Medicine

## 2018-03-31 ENCOUNTER — Encounter: Payer: Self-pay | Admitting: Family Medicine

## 2018-03-31 VITALS — BP 130/84 | HR 82 | Temp 98.1°F | Resp 14 | Ht 65.0 in | Wt 178.0 lb

## 2018-03-31 DIAGNOSIS — L240 Irritant contact dermatitis due to detergents: Secondary | ICD-10-CM | POA: Diagnosis not present

## 2018-03-31 DIAGNOSIS — L299 Pruritus, unspecified: Secondary | ICD-10-CM

## 2018-03-31 DIAGNOSIS — R21 Rash and other nonspecific skin eruption: Secondary | ICD-10-CM | POA: Diagnosis not present

## 2018-03-31 MED ORDER — PREDNISONE 10 MG (21) PO TBPK: ORAL_TABLET | ORAL | 0 refills | Status: DC

## 2018-03-31 MED ORDER — FAMOTIDINE 40 MG PO TABS: 40.0000 mg | ORAL_TABLET | Freq: Every day | ORAL | 0 refills | Status: DC

## 2018-03-31 MED ORDER — METHYLPREDNISOLONE ACETATE 80 MG/ML IJ SUSP
80.0000 mg | Freq: Once | INTRAMUSCULAR | Status: AC
  Administered 2018-03-31: 80 mg via INTRAMUSCULAR

## 2018-03-31 NOTE — Patient Instructions (Addendum)
Take daily antihistamine claritin 10 mg or allegra 180 mg in addition to the pepcid I have prescribed to help calm body's histamine response  Steroid taper  Cetaphil or CeraVe moisturizing CREAMS are good for sensitive skin  Avoid heavily scented detergents, soaps, lotions -- irritating to skin

## 2018-03-31 NOTE — Progress Notes (Signed)
Subjective:    Patient ID: Natalie Gilbert, female    DOB: [birth year], 43 y.o.   MRN: [id number]  HPI   Patient presents the clinic complaining of a very itchy rash that is on her arms, legs and abdomen and back.  Patient states she did recently switch laundry detergents to Tide.  Development of rash seems to coincide along with the timing she switched detergent.  Otherwise denies any new lotions, soaps, places or foods.  Patient also states she has had dryness and itching on her hands off and on for many months.  States she thinks the purulent hand sanitizer she uses at work is very irritating to her skin.  Patient has been putting a clobetasol topical cream on hands, has used this off and on for months and has noticed that skin on her hands seems to be lighter than the rest of her skin.   Patient Active Problem List   Diagnosis Date Noted  . 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 Allergic rhinitis 07/31/2017  . Hypertriglyceridemia 05/14/2017  . Obesity (BMI 30-39.9) 05/19/2016  . Depressive disorder, not elsewhere classified 09/19/2012   Social History   Tobacco Use  . Smoking status: Never Smoker  . Smokeless tobacco: Never Used  Substance Use Topics  . Alcohol use: No    Review of Systems  Constitutional: Negative for chills, fatigue and fever.  HENT: Negative for congestion, ear pain, sinus pain and sore throat.   Eyes: Negative.   Respiratory: Negative for cough, shortness of breath and wheezing.   Cardiovascular: Negative for chest pain, palpitations and leg swelling.  Gastrointestinal: Negative for abdominal pain, diarrhea, nausea and vomiting.  Genitourinary: Negative for dysuria, frequency and urgency.  Musculoskeletal: Negative for arthralgias and myalgias.  Skin: +rash, itching.  Neurological: Negative for syncope, light-headedness and headaches.  Psychiatric/Behavioral: The patient is not nervous/anxious.       Objective:   Physical Exam Vitals signs and nursing  note reviewed.  Constitutional:      General: She is not in acute distress.    Appearance: She is not ill-appearing, toxic-appearing or diaphoretic.  Eyes:     General: No scleral icterus.    Extraocular Movements: Extraocular movements intact.     Conjunctiva/sclera: Conjunctivae normal.  Cardiovascular:     Rate and Rhythm: Normal rate.  Pulmonary:     Effort: Pulmonary effort is normal. No respiratory distress.     Breath sounds: Normal breath sounds.  Skin:    General: Skin is warm and dry.     Coloration: Skin is not jaundiced or pale.     Comments: +maculopapular rash on bilateral forearms, upper back, ABD, upper legs. Itching and dryness of skin on hands.   Neurological:     Mental Status: She is alert and oriented to person, place, and time.  Psychiatric:        Mood and Affect: Mood normal.        Behavior: Behavior normal.    Vitals:   03/31/18 1223  BP: 130/84  Pulse: 82  Resp: 14  Temp: 98.1 F (36.7 C)  SpO2: 98%      Assessment & Plan:   Contact dermatitis, rash, itching-suspect patient's diffuse body rash is related to skin sensitivity to the Tide laundry detergent she recently switched to.  We will give her IM steroid x1 in clinic.  She will also take oral steroid taper and will do a histamine blockade using either Claritin or Allegra in addition to Pepcid 40 mg  once daily.  Patient advised to stop using steroid cream on her hands as it seems to be lightening her skin.  Advised to change to either a Cetaphil or CeraVe moisturizing cream, these are good creams for sensitive skin.  Also advised to avoid heavily scented detergents, soaps, lotions as the can be extremely irritating to the skin.  Administrations This Visit    methylPREDNISolone acetate (DEPO-MEDROL) injection 80 mg    Admin Date 03/31/2018 Action Given Dose 80 mg Route Intramuscular Administered By Colleen Can          Advised that if symptoms continue to persist, we most likely  will have to consider referral to dermatology.  Advised to follow-up with PCP as regularly scheduled and return to clinic sooner if any issues arise.

## 2018-04-30 DIAGNOSIS — Z23 Encounter for immunization: Secondary | ICD-10-CM | POA: Diagnosis not present

## 2018-04-30 DIAGNOSIS — L249 Irritant contact dermatitis, unspecified cause: Secondary | ICD-10-CM | POA: Diagnosis not present

## 2018-05-08 DIAGNOSIS — Z1389 Encounter for screening for other disorder: Secondary | ICD-10-CM | POA: Diagnosis not present

## 2018-05-08 DIAGNOSIS — Z124 Encounter for screening for malignant neoplasm of cervix: Secondary | ICD-10-CM | POA: Diagnosis not present

## 2018-05-08 DIAGNOSIS — Z3041 Encounter for surveillance of contraceptive pills: Secondary | ICD-10-CM | POA: Diagnosis not present

## 2018-05-08 DIAGNOSIS — Z1231 Encounter for screening mammogram for malignant neoplasm of breast: Secondary | ICD-10-CM | POA: Diagnosis not present

## 2018-05-08 DIAGNOSIS — Z1151 Encounter for screening for human papillomavirus (HPV): Secondary | ICD-10-CM | POA: Diagnosis not present

## 2018-05-08 DIAGNOSIS — Z01419 Encounter for gynecological examination (general) (routine) without abnormal findings: Secondary | ICD-10-CM | POA: Diagnosis not present

## 2018-05-08 DIAGNOSIS — Z6831 Body mass index (BMI) 31.0-31.9, adult: Secondary | ICD-10-CM | POA: Diagnosis not present

## 2018-05-08 DIAGNOSIS — Z130 Encounter for screening for diseases of the blood and blood-forming organs and certain disorders involving the immune mechanism: Secondary | ICD-10-CM | POA: Diagnosis not present

## 2018-05-08 DIAGNOSIS — Z113 Encounter for screening for infections with a predominantly sexual mode of transmission: Secondary | ICD-10-CM | POA: Diagnosis not present

## 2018-07-09 ENCOUNTER — Other Ambulatory Visit: Payer: Self-pay

## 2018-07-09 ENCOUNTER — Ambulatory Visit (INDEPENDENT_AMBULATORY_CARE_PROVIDER_SITE_OTHER): Payer: 59 | Admitting: Allergy

## 2018-07-09 ENCOUNTER — Encounter: Payer: Self-pay | Admitting: Allergy

## 2018-07-09 VITALS — BP 142/84 | HR 70 | Temp 98.6°F | Resp 16 | Ht 65.0 in | Wt 179.4 lb

## 2018-07-09 DIAGNOSIS — J3089 Other allergic rhinitis: Secondary | ICD-10-CM | POA: Diagnosis not present

## 2018-07-09 DIAGNOSIS — L209 Atopic dermatitis, unspecified: Secondary | ICD-10-CM | POA: Insufficient documentation

## 2018-07-09 DIAGNOSIS — L301 Dyshidrosis [pompholyx]: Secondary | ICD-10-CM | POA: Diagnosis not present

## 2018-07-09 DIAGNOSIS — R21 Rash and other nonspecific skin eruption: Secondary | ICD-10-CM

## 2018-07-09 MED ORDER — HYDROXYZINE HCL 25 MG PO TABS
ORAL_TABLET | ORAL | 5 refills | Status: DC
Start: 2018-07-09 — End: 2020-08-10

## 2018-07-09 MED ORDER — CRISABOROLE 2 % EX OINT: 1.0000 "application " | TOPICAL_OINTMENT | Freq: Two times a day (BID) | CUTANEOUS | 3 refills | Status: DC

## 2018-07-09 MED ORDER — CETIRIZINE HCL 10 MG PO TABS: 10.0000 mg | ORAL_TABLET | Freq: Every day | ORAL | 5 refills | Status: DC

## 2018-07-09 NOTE — Patient Instructions (Addendum)
Today's skin testing showed: Positive to grass, weeds, trees, dust mites, cat.   Skin:  Medications: . Only apply to affected areas that are "rough and red" . Face: desonide ointment twice daily as needed to affected areas (up to 7 days in a row); stop when clear and can restart as needed for flares. Hands:  . Start Eucrisa twice a day for 1 month.   Itching: . Take Zyrtec 10mg  in the morning . Take hydroxyzine 25mg  1 hour before bed  Schedule for patch testing - bring in soap, gloves from work.   Allergies:  Start environmental control measures as below.  The zyrtec should help with these symptoms as well.   Follow up in 2 months  Skin care recommendations  Bath time: . Always use lukewarm water. AVOID very hot or cold water. Marland Kitchen Keep bathing time to 5-10 minutes. . Do NOT use bubble bath. . Use a mild soap and use just enough to wash the dirty areas. . Do NOT scrub skin vigorously.  . After bathing, pat dry your skin with a towel. Do NOT rub or scrub the skin.  Moisturizers and prescriptions:  . ALWAYS apply moisturizers immediately after bathing (within 3 minutes). This helps to lock-in moisture. . Use the moisturizer several times a day over the whole body. Kermit Balo summer moisturizers include: Aveeno, CeraVe, Cetaphil. Kermit Balo winter moisturizers include: Aquaphor, Vaseline, Cerave, Cetaphil, Eucerin, Vanicream. . When using moisturizers along with medications, the moisturizer should be applied about one hour after applying the medication to prevent diluting effect of the medication or moisturize around where you applied the medications. When not using medications, the moisturizer can be continued twice daily as maintenance.  Laundry and clothing: . Avoid laundry products with added color or perfumes. . Use unscented hypo-allergenic laundry products such as Tide free, Cheer free & gentle, and All free and clear.  . If the skin still seems dry or sensitive, you can try  double-rinsing the clothes. . Avoid tight or scratchy clothing such as wool. . Do not use fabric softeners or dyer sheets.  Reducing Pollen Exposure . Pollen seasons: trees (spring), grass (summer) and ragweed/weeds (fall). Marland Kitchen Keep windows closed in your home and car to lower pollen exposure.  Susa Simmonds air conditioning in the bedroom and throughout the house if possible.  . Avoid going out in dry windy days - especially early morning. . Pollen counts are highest between 5 - 10 AM and on dry, hot and windy days.  . Save outside activities for late afternoon or after a heavy rain, when pollen levels are lower.  . Avoid mowing of grass if you have grass pollen allergy. Marland Kitchen Be aware that pollen can also be transported indoors on people and pets.  . Dry your clothes in an automatic dryer rather than hanging them outside where they might collect pollen.  . Rinse hair and eyes before bedtime. Control of House Dust Mite Allergen . Dust mite allergens are a common trigger of allergy and asthma symptoms. While they can be found throughout the house, these microscopic creatures thrive in warm, humid environments such as bedding, upholstered furniture and carpeting. . Because so much time is spent in the bedroom, it is essential to reduce mite levels there.  . Encase pillows, mattresses, and box springs in special allergen-proof fabric covers or airtight, zippered plastic covers.  . Bedding should be washed weekly in hot water (130 F) and dried in a hot dryer. Allergen-proof covers are available  for comforters and pillows that can't be regularly washed.  Wendee Copp the allergy-proof covers every few months. Minimize clutter in the bedroom. Keep pets out of the bedroom.  Marland Kitchen Keep humidity less than 50% by using a dehumidifier or air conditioning. You can buy a humidity measuring device called a hygrometer to monitor this.  . If possible, replace carpets with hardwood, linoleum, or washable area rugs. If that's not  possible, vacuum frequently with a vacuum that has a HEPA filter. . Remove all upholstered furniture and non-washable window drapes from the bedroom. . Remove all non-washable stuffed toys from the bedroom.  Wash stuffed toys weekly. Pet Allergen Avoidance: . Contrary to popular opinion, there are no "hypoallergenic" breeds of dogs or cats. That is because people are not allergic to an animal's hair, but to an allergen found in the animal's saliva, dander (dead skin flakes) or urine. Pet allergy symptoms typically occur within minutes. For some people, symptoms can build up and become most severe 8 to 12 hours after contact with the animal. People with severe allergies can experience reactions in public places if dander has been transported on the pet owners' clothing. Marland Kitchen Keeping an animal outdoors is only a partial solution, since homes with pets in the yard still have higher concentrations of animal allergens. . Before getting a pet, ask your allergist to determine if you are allergic to animals. If your pet is already considered part of your family, try to minimize contact and keep the pet out of the bedroom and other rooms where you spend a great deal of time. . As with dust mites, vacuum carpets often or replace carpet with a hardwood floor, tile or linoleum. . High-efficiency particulate air (HEPA) cleaners can reduce allergen levels over time. . While dander and saliva are the source of cat and dog allergens, urine is the source of allergens from rabbits, hamsters, mice and [place] pigs; so ask a non-allergic family member to clean the animal's cage. . If you have a pet allergy, talk to your allergist about the potential for allergy immunotherapy (allergy shots). This strategy can often provide long-term relief.

## 2018-07-09 NOTE — Assessment & Plan Note (Signed)
Rash on hands for the past 1.5 years. Seen by dermatology and tried topical steroid creams with minimal benefit. 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   Today's skin testing was positive to: grass, weeds, trees, dust mites, cat. Discussed with patient that not sure how much of this is contributing to her hand rash.   Start proper skin care and environmental control measures.  Medications: . Only apply to affected areas that are "rough and red" . Face: desonide ointment twice daily as needed to affected areas (up to 7 days in a row); stop when clear and can restart as needed for flares. Hands:  . Start Eucrisa twice a day for 1 month.   Itching: . Take Zyrtec 10mg  in the morning . Take hydroxyzine 25mg  1 hour before bed  Schedule for patch testing - bring in soap, gloves from work.

## 2018-07-09 NOTE — Assessment & Plan Note (Signed)
.   See assessment and plan as above. 

## 2018-07-09 NOTE — Progress Notes (Signed)
New Patient Note  RE: Natalie Gilbert MRN: [id number] DOB: Oct 12, 1975 Date of Office Visit: 07/09/2018  Referring provider: Martinique, Betty G, MD Primary care provider: Martinique, Betty G, MD  Chief Complaint: Allergies; Eczema; and Rash  History of Present Illness: I had the pleasure of seeing Natalie Gilbert for initial evaluation at the [facility] on 07/09/2018. She is a 43 y.o. female, who is self-referred here for the evaluation of eczema/rash.   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 Mainly occurs on her hands. Describes them as burning, itchy, hyperkeratotic. The rash can be both raised and flat. Individual rashes lasts about a few weeks. No ecchymosis upon resolution. Associated symptoms include: none. Suspected triggers are laundry detergent or hand sanitizer. Denies any fevers, chills, changes in medications, foods, personal care products or recent infections. She has tried the following therapies: topical steroid creams with minimal benefit. Systemic steroids yes with some benefit.  Previous work up includes: saw dermatologist - given clobetasol and desonide with minimal benefit. No previous skin biopsy or patch testing.  Previous history of rash/hives: used to have eczema as a child. Patient is up to date with the following cancer screening tests: mammogram and pap smears.  The bumpy rash showed up about 1 week ago and patient was doing yardwork.   Rhinitis: She reports symptoms of sneezing, coughing, epistaxis, itchy nose. Symptoms have been going on for 40+ years. The symptoms are present spring. triggers include exposure to no. Anosmia: no. Headache: no. She has used Flonase, benadryl with some improvement in symptoms. Sinus infections: no. Previous work up includes: none.  Assessment and Plan: Natalie Gilbert is a 43 y.o. female with: Rash and other nonspecific skin eruption Rash on hands for the past 1.5  years. Seen by dermatology and tried topical steroid creams with minimal benefit. 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   Today's skin testing was positive to: grass, weeds, trees, dust mites, cat. Discussed with patient that not sure how much of this is contributing to her hand rash.   Start proper skin care and environmental control measures.  Medications: . Only apply to affected areas that are "rough and red" . Face: desonide ointment twice daily as needed to affected areas (up to 7 days in a row); stop when clear and can restart as needed for flares. Hands:  . Start Eucrisa twice a day for 1 month.   Itching: . Take Zyrtec 10mg  in the morning . Take hydroxyzine 25mg  1 hour before bed  Schedule for patch testing - bring in soap, gloves from work.   Dyshidrotic hand dermatitis  See assessment and plan as above.   Other allergic rhinitis Rhinitis symptoms for the past 40+ years mainly during the spring.  Tried Flonase and Benadryl with good benefit.  Today's skin testing showed: positive to grass, weeds, trees, dust mites, cat.  Discussed environmental control measures.  The zyrtec should help with these symptoms as well.  May use Flonase 1 to 2 sprays daily as needed for nasal symptoms.  Return in about 2 months (around 09/08/2018) for Patch testing.  Meds ordered this encounter  Medications  . Crisaborole (EUCRISA) 2 % OINT    Sig: Apply 1 application topically 2 (two) times a day.    Dispense:  100 g    Refill:  3  . hydrOXYzine (ATARAX/VISTARIL) 25 MG tablet    Sig: 1 tablet 1 hour before bedtime as needed for itching.    Dispense:  30 tablet    Refill:  5  . cetirizine (ZYRTEC) 10 MG tablet    Sig: Take 1 tablet (10 mg total) by mouth daily.    Dispense:  30 tablet    Refill:  5   Other allergy screening: Asthma: no Rhino  conjunctivitis: yes Food allergy: no Medication allergy: no Hymenoptera allergy: no Urticaria: yes twice in the past bu not recently Eczema:yes  As a child History of recurrent infections suggestive of immunodeficency: no  Diagnostics: Skin Testing: Environmental allergy panel and select foods. Positive test to: grass, weeds, trees, dust mites, cat. Negative test to: basic common foods.  Results discussed with patient/family. Airborne Adult Perc - 07/09/18 1004    Time Antigen Placed  1004    Allergen Manufacturer  Greer    Location  Back    Number of Test  59    Panel 1  Select    1. Control-Buffer 50% Glycerol  Negative    2. Control-Histamine 1 mg/ml  2+    3. Albumin saline  Negative    4. [place]  4+    5. [place]  4+    6. [place]  4+    7. [place] Blue  3+    8. Meadow Fescue  Negative    9. Perennial Rye  Negative    10. Sweet Vernal  Negative    11. Timothy  4+    12. Cocklebur  Negative    13. Burweed Marshelder  Negative    14. Ragweed, short  Negative    15. Ragweed, Giant  Negative    16. Plantain,  English  3+    17. Lamb's Quarters  3+    18. Sheep Sorrell  3+    19. Rough Pigweed  3+    20. Marsh Elder, Rough  Negative    21. Mugwort, Common  Negative    22. Ash mix  2+    23. Birch mix  2+    24. Beech American  2+    25. Box, Elder  2+    26. Cedar, red  3+    27. Cottonwood, Eastern  3+    28. Elm mix  3+    29. Hickory mix  2+    30. Maple mix  2+    31. Oak, [place] mix  3+    32. Pecan Pollen  3+    33. Pine mix  Negative    34. Sycamore Eastern  2+    35. [place], Black Pollen  Negative    36. Alternaria alternata  Negative    37. Cladosporium Herbarum  Negative    38. Aspergillus mix  Negative    39. Penicillium mix  Negative    40. Bipolaris sorokiniana (Helminthosporium)  Negative    41. Drechslera spicifera (Curvularia)  Negative    42. Mucor plumbeus  Negative    43. Fusarium moniliforme  Negative    44. Aureobasidium pullulans  (pullulara)  Negative    45. Rhizopus oryzae  Negative    46. Botrytis cinera  Negative    47. Epicoccum nigrum  Negative    48. Phoma betae  Negative    49. Candida Albicans  Negative    50. Trichophyton mentagrophytes  Negative    51. Mite, D  Farinae  5,000 AU/ml  2+    52. Mite, D Pteronyssinus  5,000 AU/ml  Negative    53. Cat Hair 10,000 BAU/ml  4+    54.  Dog Epithelia  Negative    55. Mixed Feathers  Negative    56. Horse Epithelia  Negative    57. Cockroach, German  Negative    58. Mouse  Negative    59. Tobacco Leaf  Negative     Food Perc - 07/09/18 1004    Time Antigen Placed  1004    Allergen Manufacturer  Lavella Hammock    Location  Back    Number of allergen test  10    Food  Select    1. Peanut  Negative    2. Soybean food  Negative    3. Wheat, whole  Negative    4. Sesame  Negative    5. Milk, cow  Negative    6. Egg White, chicken  Negative    7. Casein  Negative    8. Shellfish mix  Negative    9. Fish mix  Negative    10. Cashew  Negative       Past Medical History: Patient Active Problem List   Diagnosis Date Noted  . Rash and other nonspecific skin eruption 07/09/2018  . 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 Other allergic rhinitis 07/31/2017  . Hypertriglyceridemia 05/14/2017  . Obesity (BMI 30-39.9) 05/19/2016  . Depressive disorder, not elsewhere classified 09/19/2012   Past Medical History:  Diagnosis Date  . Depression   . Heart murmur    Past Surgical History: History reviewed. No pertinent surgical history. Medication List:  Current Outpatient Medications  Medication Sig Dispense Refill  . clobetasol cream (TEMOVATE) 8.41 % Apply 1 application topically 2 (two) times daily. Daily for up to 14 days. 45 g 1  . desonide (DESOWEN) 0.05 % cream desonide 0.05 % topical cream  APPLY SPARINGLY AND RUB GENTLY INTO THE AFFECTED AREA(S) BY TOPICAL ROUTE 2 TIMES PER DAY    . fluticasone (FLONASE) 50 MCG/ACT nasal spray Place 1 spray into both  nostrils 2 (two) times daily. 16 g 3  . MICROGESTIN FE 1/20 1-20 MG-MCG tablet     . cetirizine (ZYRTEC) 10 MG tablet Take 1 tablet (10 mg total) by mouth daily. 30 tablet 5  . clotrimazole-betamethasone (LOTRISONE) cream APPLY 1 APPLICATION TOPICALLY 2 (TWO) TIMES DAILY. 45 g 0  . Crisaborole (EUCRISA) 2 % OINT Apply 1 application topically 2 (two) times a day. 100 g 3  . famotidine (PEPCID) 40 MG tablet Take 1 tablet (40 mg total) by mouth daily for 14 days. 14 tablet 0  . fluocinonide ointment (LIDEX) 0.05 % fluocinonide 0.05 % topical ointment  APPLY TO AFFECTED AREA OF HANDS TWICE A DAY FOR 14 DAYS    . hydrOXYzine (ATARAX/VISTARIL) 25 MG tablet 1 tablet 1 hour before bedtime as needed for itching. 30 tablet 5   No current facility-administered medications for this visit.    Allergies: No Known Allergies Social History: Social History   Socioeconomic History  . Marital status: Single    Spouse name: Not on file  . Number of children: Not on file  . Years of education: Not on file  . Highest education level: Not on file  Occupational History  . Not on file  Social Needs  . Financial resource strain: Not on file  . Food insecurity:    Worry: Not on file    Inability:  Not on file  . Transportation needs:    Medical: Not on file    Non-medical: Not on file  Tobacco Use  . Smoking status: Never Smoker  . Smokeless tobacco: Never Used  Substance and Sexual Activity  . Alcohol use: No  . Drug use: No  . Sexual activity: Yes    Birth control/protection: Spermicide  Lifestyle  . Physical activity:    Days per week: Not on file    Minutes per session: Not on file  . Stress: Not on file  Relationships  . Social connections:    Talks on phone: Not on file    Gets together: Not on file    Attends religious service: Not on file    Active member of club or organization: Not on file    Attends meetings of clubs or organizations: Not on file    Relationship status: Not on  file  Other Topics Concern  . Not on file  Social History Narrative  . Not on file   Lives in a house which is 42 years old. Smoking: no Occupation: Proofreader HistoryFreight forwarder in the house: no Charity fundraiser in the family room: yes Carpet in the bedroom: yes Heating: electric Cooling: central Pet: no but significant other has a cat at home.   Family History: Family History  Problem Relation Age of Onset  . Hypertension Mother   . Heart murmur Father   . Hypertension Brother   . Diabetes Maternal Grandmother   . Breast cancer Paternal Grandmother    Problem                               Relation Asthma                                   No  Eczema                                No  Food allergy                          No  Allergic rhino conjunctivitis     No   Review of Systems  Constitutional: Negative for appetite change, chills, fever and unexpected weight change.  HENT: Positive for sneezing. Negative for congestion and rhinorrhea.   Eyes: Negative for itching.  Respiratory: Negative for cough, chest tightness, shortness of breath and wheezing.   Cardiovascular: Negative for chest pain.  Gastrointestinal: Negative for abdominal pain.  Genitourinary: Negative for difficulty urinating.  Skin: Positive for rash.  Allergic/Immunologic: Positive for environmental allergies. Negative for food allergies.  Neurological: Negative for headaches.   Objective: BP (!) 142/84   Pulse 70   Temp 98.6 F (37 C)   Resp 16   Ht 5\' 5"  (1.651 m)   Wt 179 lb 6.4 oz (81.4 kg)   SpO2 97%   BMI 29.85 kg/m  Body mass index is 29.85 kg/m. Physical Exam  Constitutional: She is oriented to person, place, and time. She appears well-developed and well-nourished.  HENT:  Head: Normocephalic and atraumatic.  Right Ear: External ear normal.  Left Ear: External ear normal.  Nose: Nose normal.  Mouth/Throat: Oropharynx is clear and moist.  Eyes: Conjunctivae and EOM are  normal.  Neck: Neck supple.  Cardiovascular: Normal rate, regular rhythm and normal heart sounds. Exam reveals no gallop and no friction rub.  No murmur heard. Pulmonary/Chest: Effort normal and breath sounds normal. She has no wheezes. She has no rales.  Abdominal: Soft.  Neurological: She is alert and oriented to person, place, and time.  Skin: Skin is warm. Rash noted.  Leathery, hyperkeratotic patches on dorsal surface of the hands b/l with cracked areas on the fingers. There is also papular rashes present as well.   Psychiatric: She has a normal mood and affect. Her behavior is normal.  Nursing note and vitals reviewed.  The plan was reviewed with the patient/family, and all questions/concerned were addressed.  It was my pleasure to see Natalie Gilbert today and participate in her care. Please feel free to contact me with any questions or concerns.  Sincerely,  Rexene Alberts, DO Allergy & Immunology  [facility] office: [phone] [facility] office: [phone]

## 2018-07-09 NOTE — Assessment & Plan Note (Addendum)
Rhinitis symptoms for the past 40+ years mainly during the spring.  Tried Flonase and Benadryl with good benefit.  Today's skin testing showed: positive to grass, weeds, trees, dust mites, cat.  Discussed environmental control measures.  The zyrtec should help with these symptoms as well.  May use Flonase 1 to 2 sprays daily as needed for nasal symptoms.

## 2018-08-13 DIAGNOSIS — L309 Dermatitis, unspecified: Secondary | ICD-10-CM | POA: Diagnosis not present

## 2018-08-13 DIAGNOSIS — Z79899 Other long term (current) drug therapy: Secondary | ICD-10-CM | POA: Diagnosis not present

## 2018-08-13 DIAGNOSIS — L209 Atopic dermatitis, unspecified: Secondary | ICD-10-CM | POA: Diagnosis not present

## 2018-08-23 ENCOUNTER — Telehealth (INDEPENDENT_AMBULATORY_CARE_PROVIDER_SITE_OTHER): Payer: 59 | Admitting: Pharmacist

## 2018-08-23 DIAGNOSIS — Z79899 Other long term (current) drug therapy: Secondary | ICD-10-CM

## 2018-08-23 MED ORDER — DUPIXENT 300 MG/2ML ~~LOC~~ SOSY: PREFILLED_SYRINGE | SUBCUTANEOUS | 1 refills | Status: DC

## 2018-08-23 MED ORDER — DUPIXENT 300 MG/2ML ~~LOC~~ SOSY: PREFILLED_SYRINGE | SUBCUTANEOUS | 3 refills | Status: DC

## 2018-08-23 MED FILL — DUPIXENT 300 MG/2 ML SAFE S: 300 | 14 days supply | Qty: 4 | Fill #0

## 2018-08-23 NOTE — Progress Notes (Signed)
   S: Patient presents for review of their specialty medication therapy.  Patient is currently taking Dupixent for atopic dermatitis. Patient is managed by Dr. Renda Rolls for this.   Adherence: has not started the medication yet  Efficacy: has not started the medication yet  Dosing: 300 mg every 14 days after loading dose of 600 mg x 1  Dose adjustments: Renal: no dose adjustments (has not been studied) Hepatic: no dose adjustments (has not been studied)  Screening: TB test: not on file  Monitoring: S/sx of infection: denies S/sx of hypersensitivity: has not started medication yet S/sx of ocular effects: has not started medication yet S/sx of eosinophilia/vasculitis: has not started medication yet  O:     Lab Results  Component Value Date   WBC 12.8 (H) 11/24/2014   HGB 12.1 11/24/2014   HCT 37.0 11/24/2014   MCV 87.0 11/24/2014   PLT 429.0 (H) 11/24/2014      Chemistry      Component Value Date/Time   NA 138 05/15/2017 1240   K 4.5 05/15/2017 1240   CL 103 05/15/2017 1240   CO2 29 05/15/2017 1240   BUN 11 05/15/2017 1240   CREATININE 0.79 05/15/2017 1240      Component Value Date/Time   CALCIUM 9.8 05/15/2017 1240   ALKPHOS 55 10/28/2014 1318   AST 23 10/28/2014 1318   ALT 19 10/28/2014 1318   BILITOT 0.3 10/28/2014 1318       A/P: 1. Medication review: Patient currently on [place] for atopic dermatitis. Reviewed the medication with the patient, including the following: Dupixent is a monoclonal antibody used for the treatment of asthma or atopic dermatitis. Patient educated on purpose, proper use and potential adverse effects of Dupixent. Possible adverse effects include increased risk of infection, ocular effects, vasculitis/eosinophilia, and hypersensitivity reactions. Administer as a SubQ injection and rotate sites. Allow the medication to reach room temp prior to administration (45 mins for 300 mg syringe or 30 min for 200 mg syringe). Do not shake.  Discard any unused portion. No recommendations for any changes.  Christella Hartigan, PharmD, BCPS, BCACP, CPP Clinical Pharmacist Practitioner  [phone]

## 2018-08-27 ENCOUNTER — Encounter: Payer: Self-pay | Admitting: Allergy

## 2018-08-27 ENCOUNTER — Ambulatory Visit (INDEPENDENT_AMBULATORY_CARE_PROVIDER_SITE_OTHER): Payer: 59 | Admitting: Allergy

## 2018-08-27 ENCOUNTER — Other Ambulatory Visit: Payer: Self-pay

## 2018-08-27 VITALS — BP 130/60 | HR 63 | Temp 98.6°F | Resp 18

## 2018-08-27 DIAGNOSIS — L301 Dyshidrosis [pompholyx]: Secondary | ICD-10-CM

## 2018-08-27 DIAGNOSIS — L239 Allergic contact dermatitis, unspecified cause: Secondary | ICD-10-CM | POA: Diagnosis not present

## 2018-08-27 DIAGNOSIS — L209 Atopic dermatitis, unspecified: Secondary | ICD-10-CM | POA: Diagnosis not present

## 2018-08-27 DIAGNOSIS — J3089 Other allergic rhinitis: Secondary | ICD-10-CM

## 2018-08-27 DIAGNOSIS — Z79899 Other long term (current) drug therapy: Secondary | ICD-10-CM | POA: Diagnosis not present

## 2018-08-27 DIAGNOSIS — J302 Other seasonal allergic rhinitis: Secondary | ICD-10-CM | POA: Insufficient documentation

## 2018-08-27 NOTE — Patient Instructions (Signed)
Patches placed today. Please avoid strenuous physical activities and do not get the patches on the back wet. No showering until Wednesday. Okay to take antihistamines for itching but avoid placing any creams on the back where the patches are. We will remove the patches on Wednesday and will do out initial read. Then you will come back on Friday for a final read.

## 2018-08-27 NOTE — Progress Notes (Signed)
   Follow Up Note  RE: Natalie Gilbert MRN: [id number] DOB: [birth year] Date of Office Visit: 08/27/2018  Referring provider: Martinique, Betty G, MD Primary care provider: Martinique, Betty G, MD  History of Present Illness: I had the pleasure of seeing Natalie Gilbert for a follow up visit at the [facility] [place] [facility] [place] on 08/27/2018. She is a 43 y.o. female, who is being followed for eczema/rash. Today she is here for patch test placement, given suspected history of contact dermatitis.   Eczema: Natalie Gilbert with no benefit on the hands.  Dermatology started her on Dupixent today and will be self injection at home.  Using desonide on the face and clobetasol on the hands. She was diagnosed with MRSA as well.   Taking zyrtec and hydroxyzine at night with some benefit.   Diagnostics: TRUE Test patches were placed. Also placed - soap, hand sanitizer and glove which patient brought.    Assessment and Plan: Natalie Gilbert is a 43 y.o. female with: Dyshidrotic hand dermatitis Past history - eczema on hands for the past 1.5 years. Seen by dermatology and tried topical steroid creams with minimal benefit.  Interim history - Natalie Gilbert did not help. Just started on Dupixent today.   TRUE patches and 3 items which she brought were placed on her back today (soap, hand sanitizer and glove).  Continue proper skin care regimen.  Continue Dupixent as per dermatology.  Continue desonide and clobetasol creams as per dermatology.   Allergic contact dermatitis  See assessment and plans as above.  Seasonal and perennial allergic rhinitis Past history - Rhinitis symptoms for the past 40+ years mainly during the spring.  2020 skin testing showed: positive to grass, weeds, trees, dust mites, cat.  Continue environmental control measures.  May use over the counter antihistamines such as Zyrtec (cetirizine), Claritin (loratadine), Allegra (fexofenadine), or Xyzal (levocetirizine) daily as needed.  May use  Flonase as needed.   The patient was instructed regarding proper care of the patches for the next 48 hours. Do not get patches wet - avoid showering until the next visit. Do not engage in vigorous physical activity.  Patient will follow up in 48 hours and 96 hours for patch readings.  It was my pleasure to see Natalie Gilbert today and participate in her care. Please feel free to contact me with any questions or concerns.  Sincerely,  Rexene Alberts, DO Allergy & Immunology  [facility] office: [phone] [place]

## 2018-08-27 NOTE — Assessment & Plan Note (Addendum)
Past history - Rhinitis symptoms for the past 40+ years mainly during the spring.  2020 skin testing showed: positive to grass, weeds, trees, dust mites, cat.  Continue environmental control measures.  May use over the counter antihistamines such as Zyrtec (cetirizine), Claritin (loratadine), Allegra (fexofenadine), or Xyzal (levocetirizine) daily as needed.  May use Flonase as needed.

## 2018-08-27 NOTE — Assessment & Plan Note (Signed)
   See assessment and plans as above.

## 2018-08-27 NOTE — Assessment & Plan Note (Signed)
Past history - eczema on hands for the past 1.5 years. Seen by dermatology and tried topical steroid creams with minimal benefit.  Interim history - Eucrisa did not help. Just started on Dupixent today.   TRUE patches and 3 items which she brought were placed on her back today (soap, hand sanitizer and glove).  Continue proper skin care regimen.  Continue Dupixent as per dermatology.  Continue desonide and clobetasol creams as per dermatology.

## 2018-08-29 ENCOUNTER — Other Ambulatory Visit: Payer: Self-pay

## 2018-08-29 ENCOUNTER — Ambulatory Visit: Payer: 59 | Admitting: Allergy

## 2018-08-29 ENCOUNTER — Encounter: Payer: Self-pay | Admitting: Allergy

## 2018-08-29 DIAGNOSIS — L239 Allergic contact dermatitis, unspecified cause: Secondary | ICD-10-CM

## 2018-08-29 NOTE — Progress Notes (Signed)
Follow Up Note  RE: Natalie Gilbert MRN: [id number] DOB: [birth year] Date of Office Visit: 08/29/2018  Referring provider: Martinique, Betty G, MD Primary care provider: Martinique, Betty G, MD  History of Present Illness: I had the pleasure of seeing Natalie Gilbert for a follow up visit at the [facility] [place] [facility] [place] on 08/29/2018. She is a 43 y.o. female, who is being followed for rash/eczema, allergic rhinitis. Today she is here for initial patch test interpretation, given suspected history of contact dermatitis.   Diagnostics:  TRUE TEST 48 hour reading:  Panel 2: #15 Carba Mix:2+, #17 Cl+Me-Isothiazolinone:2+, #18 Quaternium-15:2+, #20 p-Phenylenediamine:2+, #21 Formaldehyde:1+, #22 Mercapto Mix:2+ and #24 Thiuram Mix:2+  Panel 3: #32 Mercaptobenzothiazole:2+  provon soap 1+  T.R.U.E. Test - 08/29/18 1554    Time Antigen Placed  1521    Manufacturer  Other   [employer] [place]   Lot #  [id number]    Location  Back    Number of Test  39    Reading Interval  Day 5    Panel  Panel 1;Panel 2;Panel 3    1. Nickel Sulfate  0    2. Wool Alcohols  0    3. Neomycin Sulfate  0    4. Potassium Dichromate  0    5. Caine Mix  0    6. Fragrance Mix  0    7. Colophony  0    8. Paraben Mix  0    9. Negative Control  0    10. Balsam of [place]  0    11. Ethylenediamine Dihydrochloride  0    12. Cobalt Dichloride  0    13. p-tert Butylphenol Formaldehyde Resin  0    14. Epoxy Resin  0    15. Carba Mix  2    16.  Black Rubber Mix  0    17. Cl+ Me-Isothiazolinone  2    18. Quaternium-15  2    19. Methyldibromo Glutaronitrile  0    20. p-Phenylenediamine  2    21. Formaldehyde  1    22. Mercapto Mix  2    23. Thimerosal  0    24. Thiuram Mix  2    25. Diazolidinyl Urea  0    26. Quinoline Mix  0    27. Tixocortol-21-Pivalate  0    28. Gold Sodium Thiosulfate  0    29. Imidazolidinyl Urea  0    30. Budesonide  0    31. Hydrocortisone-17-Butyrate  0    32. Mercaptobenzothiazole  2     33. Bacitracin  0    34. Parthenolide  0    35. Disperse Blue 106  0    36. 2-Bromo-2-Nitropropane-1,3-diol  0    Comments  1) Purell-0, 2) Provon Soap- 1+, 3) Glove- 0        Assessment and Plan: Natalie Gilbert is a 43 y.o. female with: Allergic contact dermatitis Past history - eczema on hands for the past 1.5 years. Seen by dermatology and tried topical steroid creams with minimal benefit.  Interim history - Eucrisa did not help. Just started on Dupixent.  TRUE patches removed. 1st reading positive to:  Panel 2: #15 Carba Mix:2+, #17 Cl+Me-Isothiazolinone:2+, #18 Quaternium-15:2+, #20 p-Phenylenediamine:2+, #21 Formaldehyde:1+, #22 Mercapto Mix:2+ and #24 Thiuram Mix:2+  Panel 3: #32 Mercaptobenzothiazole:2+  Provon soap: 1+   The patient has been provided detailed information regarding the substances she is sensitive to, as well as products containing  the substances.  Meticulous avoidance of these substances is recommended. If avoidance is not possible, the use of barrier creams or lotions is recommended.   Return in about 2 days (around 08/31/2018) for Patch reading.  It was my pleasure to see Natalie Gilbert today and participate in her care. Please feel free to contact me with any questions or concerns.  Sincerely,  Rexene Alberts, DO Allergy & Immunology  [facility] office: [phone] [place]

## 2018-08-29 NOTE — Assessment & Plan Note (Signed)
Past history - eczema on hands for the past 1.5 years. Seen by dermatology and tried topical steroid creams with minimal benefit.  Interim history - Eucrisa did not help. Just started on Dupixent.  TRUE patches removed. 1st reading positive to:  Panel 2: #15 Carba Mix:2+, #17 Cl+Me-Isothiazolinone:2+, #18 Quaternium-15:2+, #20 p-Phenylenediamine:2+, #21 Formaldehyde:1+, #22 Mercapto Mix:2+ and #24 Thiuram Mix:2+  Panel 3: #32 Mercaptobenzothiazole:2+  Provon soap: 1+

## 2018-08-31 ENCOUNTER — Ambulatory Visit (INDEPENDENT_AMBULATORY_CARE_PROVIDER_SITE_OTHER): Payer: 59 | Admitting: Family Medicine

## 2018-08-31 ENCOUNTER — Encounter: Payer: Self-pay | Admitting: Family Medicine

## 2018-08-31 ENCOUNTER — Other Ambulatory Visit: Payer: Self-pay

## 2018-08-31 DIAGNOSIS — J3089 Other allergic rhinitis: Secondary | ICD-10-CM

## 2018-08-31 DIAGNOSIS — L239 Allergic contact dermatitis, unspecified cause: Secondary | ICD-10-CM | POA: Diagnosis not present

## 2018-08-31 DIAGNOSIS — J302 Other seasonal allergic rhinitis: Secondary | ICD-10-CM | POA: Diagnosis not present

## 2018-08-31 DIAGNOSIS — L301 Dyshidrosis [pompholyx]: Secondary | ICD-10-CM | POA: Diagnosis not present

## 2018-08-31 NOTE — Progress Notes (Signed)
    Follow-up Note  RE: Natalie Gilbert MRN: [id number] DOB: [birth year] Date of Office Visit: 08/31/2018  Primary care provider: Martinique, Betty G, MD Referring provider: Martinique, Betty G, MD   [name] returns to the office today for the final patch test interpretation, given suspected history of contact dermatitis.    Diagnostics:   TRUE TEST 96-hour hour reading: possible reaction to #5 (Caine mix), positive reaction to #15 (Carba mix), positive reaction to #17 (Cl+Me-Isothiazolinone), positive reaction to #18 (Quaternium-15), positive reaction to #20 (p-Phenylene-diamine), positive reaction to #21 (Formaldehyde), positive reaction to #22 (Mercapto mix), positive reaction to #24 (Thiuram mix) and positive reaction to #32 (Mercapto-benzothiazole)  Plan:  Allergic contact dermatitis - The patient has been provided detailed information regarding the substances she is sensitive to, as well as products containing the substances.   - Meticulous avoidance of these substances is recommended.  - If avoidance is not possible, the use of barrier creams or lotions is recommended. - If symptoms persist or progress despite meticulous avoidance of the substances noted above, Dermatology Referral may be warranted. - Continue a daily moisturizing routine - Apply desonide 0.05% to red, itchy areas twice a day as needed - Continue Eucrisa twice a day for the next 3 weeks - Continue Zyrtec 10 mg once a day as needed for itch and hydroxyzine 25 mg one hour before bed as needed for itch  Call the clinic if this treatment plan is not working well for you  Follow up in 6 months or sooner if needed  Thank you for the opportunity to care for this patient.  Please do not hesitate to contact me with questions.  Gareth Morgan, FNP [facility] [place] [facility]

## 2018-08-31 NOTE — Patient Instructions (Addendum)
possible reaction to #5 [facility] mix), positive reaction to #15 (Carba mix), positive reaction to #17 (Cl+Me-Isothiazolinone), positive reaction to #18 (Quaternium-15), positive reaction to #20 (p-Phenylene-diamine), positive reaction to #21 (Formaldehyde), positive reaction to #22 (Mercapto mix), positive reaction to #24 (Thiuram mix) and positive reaction to #32 (Mercapto-benzothiazole) and positive reaction to Provan soap  Plan:  Allergic contact dermatitis - The patient has been provided detailed information regarding the substances she is sensitive to, as well as products containing the substances.   - Meticulous avoidance of these substances is recommended.  - If avoidance is not possible, the use of barrier creams or lotions is recommended. - If symptoms persist or progress despite meticulous avoidance of the substances noted above, Dermatology Referral may be warranted. - Continue a daily moisturizing routine - Apply desonide 0.05% to red, itchy areas twice a day as needed - Continue Eucrisa twice a day for the next 3 weeks - Continue Zyrtec 10 mg once a day as needed for itch and hydroxyzine 25 mg one hour before bed as needed for itch  Call the clinic if this treatment plan is not working well for you  Follow up in 6 months or sooner if needed

## 2018-09-28 ENCOUNTER — Telehealth: Payer: Self-pay | Admitting: Family Medicine

## 2018-09-28 NOTE — Telephone Encounter (Signed)
Called pt about message sent in:  Appointment Request From: Natalie Gilbert    With Provider: Betty Martinique, MD [[facility]    Preferred Date Range: Any date 10/04/2018 or later    Preferred Times: Any Time    Reason for visit: Request an Appointment    Comments:  Physical    Left message

## 2018-10-01 ENCOUNTER — Encounter: Payer: Self-pay | Admitting: Family Medicine

## 2018-10-01 ENCOUNTER — Ambulatory Visit (INDEPENDENT_AMBULATORY_CARE_PROVIDER_SITE_OTHER): Payer: 59 | Admitting: Family Medicine

## 2018-10-01 VITALS — BP 126/82 | HR 72 | Temp 98.1°F | Resp 12 | Ht 65.0 in | Wt 183.5 lb

## 2018-10-01 DIAGNOSIS — Z130 Encounter for screening for diseases of the blood and blood-forming organs and certain disorders involving the immune mechanism: Secondary | ICD-10-CM | POA: Diagnosis not present

## 2018-10-01 DIAGNOSIS — Z13228 Encounter for screening for other metabolic disorders: Secondary | ICD-10-CM

## 2018-10-01 DIAGNOSIS — E781 Pure hyperglyceridemia: Secondary | ICD-10-CM

## 2018-10-01 DIAGNOSIS — Z1329 Encounter for screening for other suspected endocrine disorder: Secondary | ICD-10-CM | POA: Diagnosis not present

## 2018-10-01 DIAGNOSIS — Z0000 Encounter for general adult medical examination without abnormal findings: Secondary | ICD-10-CM | POA: Diagnosis not present

## 2018-10-01 DIAGNOSIS — Z114 Encounter for screening for human immunodeficiency virus [HIV]: Secondary | ICD-10-CM | POA: Diagnosis not present

## 2018-10-01 LAB — BASIC METABOLIC PANEL
BUN: 12 mg/dL (ref 6–23)
CO2: 24 mEq/L (ref 19–32)
Calcium: 9.5 mg/dL (ref 8.4–10.5)
Chloride: 103 mEq/L (ref 96–112)
Creatinine, Ser: 0.89 mg/dL (ref 0.40–1.20)
GFR: 84.16 mL/min (ref >60.00)
Glucose, Bld: 90 mg/dL (ref 70–99)
Potassium: 4.4 mEq/L (ref 3.5–5.1)
Sodium: 136 mEq/L (ref 135–145)

## 2018-10-01 LAB — LIPID PANEL
Cholesterol: 222 mg/dL — ABNORMAL HIGH (ref 0–200)
HDL: 51.4 mg/dL (ref >39.00)
LDL Cholesterol: 136 mg/dL — ABNORMAL HIGH (ref 0–99)
NonHDL: 171.03
Total CHOL/HDL Ratio: 4
Triglycerides: 174 mg/dL — ABNORMAL HIGH (ref 0.0–149.0)
VLDL: 34.8 mg/dL (ref 0.0–40.0)

## 2018-10-01 LAB — HEMOGLOBIN A1C: Hgb A1c MFr Bld: 5.4 % (ref 4.6–6.5)

## 2018-10-01 NOTE — Assessment & Plan Note (Signed)
Recommend low-fat diet. Further recommendation will be given according to 10 years CVD risk and lipid panel results.

## 2018-10-01 NOTE — Progress Notes (Signed)
HPI:   Natalie Gilbert is a 43 y.o. female, who is here today for her routine physical.  Last CPE: 06/30/2016.  Regular exercise 3 or more time per week: None but she is active at work. Following a healthy diet: Not consistently. She lives alone.  Her sister lives close by, they talk daily.  Chronic medical problems:  Hypertriglyceridemia: Currently she is on nonpharmacologic treatment. She tries to follow a low-fat diet but not consistently. Lab Results  Component Value Date   CHOL 172 07/15/2016   HDL 40.90 07/15/2016   LDLDIRECT 87.0 07/15/2016   TRIG 299.0 (H) 07/15/2016   CHOLHDL 4 07/15/2016    Allergy rhinitis. Follows with dermatologist for eczema. Fibroid tumors, she follows with her gynecologist. She saw Dr. arms, immunologist, due to allergic contact dermatitis.  Last visit 08/31/2018.  Pap smear:05/08/18.  She follows with her gynecologist periodically, Dr Willis Modena  Immunization History  Administered Date(s) Administered  . Tdap 10/19/2012, 06/30/2016    Review of Systems  Constitutional: Negative for appetite change, fatigue and fever.  HENT: Negative for hearing loss, mouth sores, sore throat and trouble swallowing.   Eyes: Negative for photophobia and visual disturbance.  Respiratory: Negative for cough, shortness of breath and wheezing.   Cardiovascular: Negative for chest pain and leg swelling.  Gastrointestinal: Negative for abdominal pain, nausea and vomiting.       No changes in bowel habits.  Endocrine: Negative for cold intolerance, heat intolerance, polydipsia, polyphagia and polyuria.  Genitourinary: Negative for decreased urine volume, hematuria, vaginal bleeding and vaginal discharge.  Musculoskeletal: Negative for arthralgias, back pain and neck pain.  Skin: Positive for rash. Negative for wound.  Allergic/Immunologic: Positive for environmental allergies.  Neurological: Negative for syncope, weakness and headaches.   Psychiatric/Behavioral: Negative for confusion. The patient is not nervous/anxious.   All other systems reviewed and are negative.  Current Outpatient Medications on File Prior to Visit  Medication Sig Dispense Refill  . cetirizine (ZYRTEC) 10 MG tablet Take 1 tablet (10 mg total) by mouth daily. 30 tablet 5  . clobetasol cream (TEMOVATE) 6.38 % Apply 1 application topically 2 (two) times daily. Daily for up to 14 days. 45 g 1  . clotrimazole-betamethasone (LOTRISONE) cream APPLY 1 APPLICATION TOPICALLY 2 (TWO) TIMES DAILY. 45 g 0  . Crisaborole (EUCRISA) 2 % OINT Apply 1 application topically 2 (two) times a day. 100 g 3  . desonide (DESOWEN) 0.05 % cream desonide 0.05 % topical cream  APPLY SPARINGLY AND RUB GENTLY INTO THE AFFECTED AREA(S) BY TOPICAL ROUTE 2 TIMES PER DAY    . dupilumab (DUPIXENT) 300 MG/2ML prefilled syringe Inject 600 mg (2 syringes) under the skin on day 1, then 300 mg (1 syringe) every other week starting on Day 15 as directed 4 mL 1  . dupilumab (DUPIXENT) 300 MG/2ML prefilled syringe Inject 300 mg (1 syringe) under the skin every other week starting on day 15 4 mL 3  . fluocinonide ointment (LIDEX) 0.05 % fluocinonide 0.05 % topical ointment  APPLY TO AFFECTED AREA OF HANDS TWICE A DAY FOR 14 DAYS    . fluticasone (FLONASE) 50 MCG/ACT nasal spray Place 1 spray into both nostrils 2 (two) times daily. 16 g 3  . hydrOXYzine (ATARAX/VISTARIL) 25 MG tablet 1 tablet 1 hour before bedtime as needed for itching. 30 tablet 5  . MICROGESTIN FE 1/20 1-20 MG-MCG tablet      No current facility-administered medications on file prior to visit.  Past Medical History:  Diagnosis Date  . Depression   . Eczema   . Heart murmur     History reviewed. No pertinent surgical history.  No Known Allergies  Family History  Problem Relation Age of Onset  . Hypertension Mother   . Heart murmur Father   . Hypertension Brother   . Diabetes Maternal Grandmother   . Breast  cancer Paternal Grandmother     Social History   Socioeconomic History  . Marital status: Single    Spouse name: Not on file  . Number of children: Not on file  . Years of education: Not on file  . Highest education level: Not on file  Occupational History  . Not on file  Social Needs  . Financial resource strain: Not on file  . Food insecurity    Worry: Not on file    Inability: Not on file  . Transportation needs    Medical: Not on file    Non-medical: Not on file  Tobacco Use  . Smoking status: Never Smoker  . Smokeless tobacco: Never Used  Substance and Sexual Activity  . Alcohol use: No  . Drug use: No  . Sexual activity: Yes    Birth control/protection: Spermicide  Lifestyle  . Physical activity    Days per week: Not on file    Minutes per session: Not on file  . Stress: Not on file  Relationships  . Social Herbalist on phone: Not on file    Gets together: Not on file    Attends religious service: Not on file    Active member of club or organization: Not on file    Attends meetings of clubs or organizations: Not on file    Relationship status: Not on file  Other Topics Concern  . Not on file  Social History Narrative  . Not on file   Vitals:   10/01/18 1123  BP: 126/82  Pulse: 72  Resp: 12  Temp: 98.1 F (36.7 C)  SpO2: 98%   Body mass index is 30.54 kg/m.   Wt Readings from Last 3 Encounters:  10/01/18 183 lb 8 oz (83.2 kg)  07/09/18 179 lb 6.4 oz (81.4 kg)  03/31/18 178 lb (80.7 kg)    Physical Exam  Nursing note and vitals reviewed. Constitutional: She is oriented to person, place, and time. She appears well-developed. No distress.  HENT:  Head: Normocephalic and atraumatic.  Right Ear: Hearing, tympanic membrane, external ear and ear canal normal.  Left Ear: Hearing, tympanic membrane, external ear and ear canal normal.  Mouth/Throat: Uvula is midline, oropharynx is clear and moist and mucous membranes are normal.  Eyes:  Pupils are equal, round, and reactive to light. Conjunctivae and EOM are normal.  Neck: No tracheal deviation present. No thyromegaly present.  Cardiovascular: Normal rate and regular rhythm.  No murmur heard. Pulses:      Dorsalis pedis pulses are 2+ on the right side and 2+ on the left side.  Respiratory: Effort normal and breath sounds normal. No respiratory distress.  GI: Soft. She exhibits no mass. There is no hepatomegaly. There is no abdominal tenderness.  Genitourinary:    Genitourinary Comments: Deferred to gyn.   Musculoskeletal:        General: No edema.     Comments: No major deformity or signs of synovitis appreciated.  Lymphadenopathy:    She has no cervical adenopathy.       Right: No supraclavicular adenopathy present.  Left: No supraclavicular adenopathy present.  Neurological: She is alert and oriented to person, place, and time. She has normal strength. No cranial nerve deficit. Coordination and gait normal.  Reflex Scores:      Bicep reflexes are 2+ on the right side and 2+ on the left side.      Patellar reflexes are 2+ on the right side and 2+ on the left side. Skin: Skin is warm. Rash noted. No erythema.     Micropapular non erythematous rash on dorsum of hands,bilateral.  Psychiatric: She has a normal mood and affect. Her speech is normal.  Well groomed, good eye contact.    ASSESSMENT AND PLAN:  Ms. Ryen Heitmeyer was here today annual physical examination.  Orders Placed This Encounter  Procedures  . Basic metabolic panel  . HIV antibody  . Lipid panel  . Hemoglobin A1c    Lab Results  Component Value Date   HGBA1C 5.4 10/01/2018   Lab Results  Component Value Date   CHOL 222 (H) 10/01/2018   HDL 51.40 10/01/2018   LDLCALC 136 (H) 10/01/2018   LDLDIRECT 87.0 07/15/2016   TRIG 174.0 (H) 10/01/2018   CHOLHDL 4 10/01/2018   Lab Results  Component Value Date   CREATININE 0.89 10/01/2018   BUN 12 10/01/2018   NA 136 10/01/2018   K  4.4 10/01/2018   CL 103 10/01/2018   CO2 24 10/01/2018     Routine general medical examination at a health care facility We discussed the importance of regular physical activity and healthy diet for prevention of chronic illness and/or complications. Preventive guidelines reviewed. Vaccination up to date. She will continue following with her gyn for female preventive care. Next CPE in a year. The 10-year ASCVD risk score Mikey Bussing DC Brooke Bonito., et al., 2013) is: 0.9%   Values used to calculate the score:     Age: 39 years     Sex: Female     Is Non-Hispanic African American: Yes     Diabetic: No     Tobacco smoker: No     Systolic Blood Pressure: 299 mmHg     Is BP treated: No     HDL Cholesterol: 51.4 mg/dL     Total Cholesterol: 222 mg/dL  Encounter for screening for HIV -     HIV antibody  Screening for endocrine, metabolic and immunity disorder -     Basic metabolic panel -     Hemoglobin A1c   Hypertriglyceridemia Recommend low-fat diet. Further recommendation will be given according to 10 years CVD risk and lipid panel results.   Return in 1 year (on 10/01/2019) for cpe.     Leeza Heiner G. Martinique, MD  [facility]. [place] office.

## 2018-10-01 NOTE — Patient Instructions (Addendum)
Today you have you routine preventive visit.  At least 150 minutes of moderate exercise per week, daily brisk walking for 15-30 min is a good exercise option. Healthy diet low in saturated (animal) fats and sweets and consisting of fresh fruits and vegetables, lean meats such as fish and white chicken and whole grains.  These are some of recommendations for screening depending of age and risk factors:   Routine general medical examination at a health care facility  Hypertriglyceridemia - Plan: Lipid panel  Encounter for screening for HIV - Plan: HIV antibody  Screening for endocrine, metabolic and immunity disorder - Plan: Basic metabolic panel, Hemoglobin A1c   - Vaccines:  Tdap vaccine every 10 years.  Shingles vaccine recommended at age 17, could be given after 42 years of age but not sure about insurance coverage.   Pneumonia vaccines:  Prevnar 13 at 65 and Pneumovax at 82. Sometimes Pneumovax is giving earlier if history of smoking, lung disease,diabetes,kidney disease among some.  Screening for diabetes at age 85 and every 3 years.  Cervical cancer prevention:  Pap smear starts at 42 years of age and continues periodically until 42 years old in low risk women. Pap smear every 3 years between 71 and 75 years old. Pap smear every 3-5 years between women 63 and older if pap smear negative and HPV screening negative.   -Breast cancer: Mammogram: There is disagreement between experts about when to start screening in low risk asymptomatic female but recent recommendations are to start screening at 90 and not later than 42 years old , every 1-2 years and after 42 yo q 2 years. Screening is recommended until 42 years old but some women can continue screening depending of healthy issues.   Colon cancer screening: starts at 42 years old until 42 years old.  Also recommended:  1. Dental visit- Brush and floss your teeth twice daily; visit your dentist twice a year. 2. Eye doctor-  Get an eye exam at least every 2 years. 3. Helmet use- Always wear a helmet when riding a bicycle, motorcycle, rollerblading or skateboarding. 4. Safe sex- If you may be exposed to sexually transmitted infections, use a condom. 5. Seat belts- Seat belts can save your live; always wear one. 6. Smoke/Carbon Monoxide detectors- These detectors need to be installed on the appropriate level of your home. Replace batteries at least once a year. 7. Skin cancer- When out in the sun please cover up and use sunscreen 15 SPF or higher. 8. Violence- If anyone is threatening or hurting you, please tell your healthcare provider.  9. Drink alcohol in moderation- Limit alcohol intake to one drink or less per day. Never drink and drive.

## 2018-10-02 LAB — HIV ANTIBODY (ROUTINE TESTING W REFLEX): HIV 1&2 Ab, 4th Generation: NONREACTIVE

## 2018-10-04 ENCOUNTER — Encounter: Payer: Self-pay | Admitting: Family Medicine

## 2018-10-08 MED FILL — DUPIXENT 300 MG/2 ML SAFE S: 300 | 28 days supply | Qty: 4 | Fill #0

## 2018-11-01 MED FILL — DUPIXENT 300 MG/2 ML SAFE S: 300 | 28 days supply | Qty: 4 | Fill #1

## 2018-11-13 ENCOUNTER — Telehealth: Payer: 59 | Admitting: Nurse Practitioner

## 2018-11-13 DIAGNOSIS — L209 Atopic dermatitis, unspecified: Secondary | ICD-10-CM

## 2018-11-13 MED ORDER — CLOBETASOL PROP EMOLLIENT BASE 0.05 % EX CREA: TOPICAL_CREAM | CUTANEOUS | 0 refills | Status: DC

## 2018-11-13 NOTE — Progress Notes (Signed)
E Visit for Rash  We are sorry that you are not feeling well. Here is how we plan to help!  Based on what you shared with me it looks like you have contact dermatitis.  Contact dermatitis is a skin rash caused by something that touches the skin and causes irritation or inflammation.  Your skin may be red, swollen, dry, cracked, and itch.  The rash should go away in a few days but can last a few weeks.  If you get a rash, it's important to figure out what caused it so the irritant can be avoided in the future.  I have rx clobetasol cream      HOME CARE:   Take cool showers and avoid direct sunlight.  Apply cool compress or wet dressings.  Take a bath in an oatmeal bath.  Sprinkle content of one Aveeno packet under running faucet with comfortably warm water.  Bathe for 15-20 minutes, 1-2 times daily.  Pat dry with a towel. Do not rub the rash.  Use hydrocortisone cream.  Take an antihistamine like Benadryl for widespread rashes that itch.  The adult dose of Benadryl is 25-50 mg by mouth 4 times daily.  Caution:  This type of medication may cause sleepiness.  Do not drink alcohol, drive, or operate dangerous machinery while taking antihistamines.  Do not take these medications if you have prostate enlargement.  Read package instructions thoroughly on all medications that you take.  GET HELP RIGHT AWAY IF:   Symptoms don't go away after treatment.  Severe itching that persists.  If you rash spreads or swells.  If you rash begins to smell.  If it blisters and opens or develops a yellow-brown crust.  You develop a fever.  You have a sore throat.  You become short of breath.  MAKE SURE YOU:  Understand these instructions. Will watch your condition. Will get help right away if you are not doing well or get worse.  Thank you for choosing an e-visit. Your e-visit answers were reviewed by a board certified advanced clinical practitioner to complete your personal care plan.  Depending upon the condition, your plan could have included both over the counter or prescription medications. Please review your pharmacy choice. Be sure that the pharmacy you have chosen is open so that you can pick up your prescription now.  If there is a problem you may message your provider in [place] to have the prescription routed to another pharmacy. Your safety is important to [place]. If you have drug allergies check your prescription carefully.  For the next 24 hours, you can use MyChart to ask questions about today's visit, request a non-urgent call back, or ask for a work or school excuse from your e-visit provider. You will get an email in the next two days asking about your experience. I hope that your e-visit has been valuable and will speed your recovery.  5-10 minutes spent reviewing and documenting in chart.

## 2018-11-27 ENCOUNTER — Ambulatory Visit: Payer: 59 | Admitting: Family Medicine

## 2018-11-28 ENCOUNTER — Ambulatory Visit: Payer: 59 | Admitting: Family Medicine

## 2018-11-28 DIAGNOSIS — Z0289 Encounter for other administrative examinations: Secondary | ICD-10-CM

## 2018-11-30 ENCOUNTER — Ambulatory Visit: Payer: 59 | Admitting: Family Medicine

## 2018-12-03 MED FILL — DUPIXENT 300 MG/2 ML SAFE S: 300 | 28 days supply | Qty: 4 | Fill #2

## 2018-12-05 ENCOUNTER — Ambulatory Visit: Payer: 59 | Admitting: Family Medicine

## 2019-01-03 MED FILL — DUPIXENT 300 MG/2 ML SAFE S: 300 | 28 days supply | Qty: 4 | Fill #3

## 2019-01-30 ENCOUNTER — Other Ambulatory Visit: Payer: Self-pay | Admitting: Pharmacist

## 2019-01-30 MED ORDER — DUPIXENT 300 MG/2ML ~~LOC~~ SOSY: PREFILLED_SYRINGE | SUBCUTANEOUS | 1 refills | Status: DC

## 2019-03-13 IMAGING — US US PELVIS COMPLETE TRANSABD/TRANSVAG
1 series · 14 of 25 positions shown · non-contrast
Comparison: None

CLINICAL DATA: Right lower quadrant pain

EXAM:
TRANSABDOMINAL AND TRANSVAGINAL ULTRASOUND OF PELVIS
TECHNIQUE: Both transabdominal and transvaginal ultrasound examinations of the
pelvis were performed. Transabdominal technique was performed for
global imaging of the pelvis including uterus, ovaries, adnexal
regions, and pelvic cul-de-sac. It was necessary to proceed with
endovaginal exam following the transabdominal exam to visualize the
uterus and adnexa.

[Series 1: us pelvis complete transabd/transvag · 0.25mm/px · 14 of 46 slices shown]
[im 1/46]
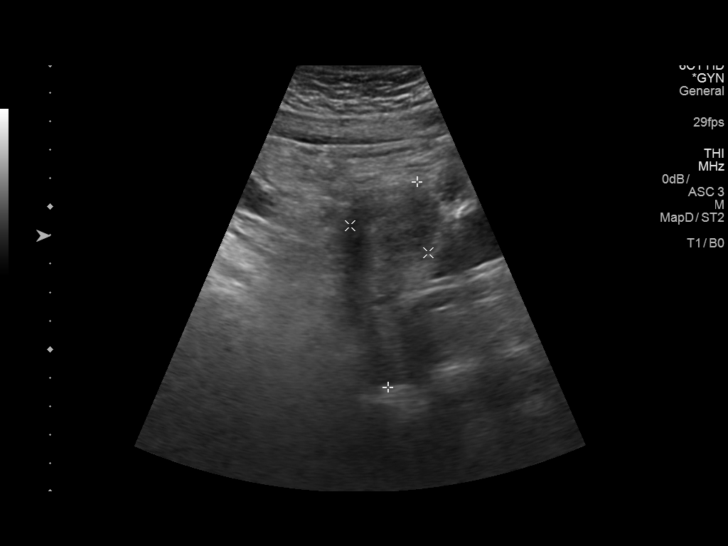
[im 4/46]
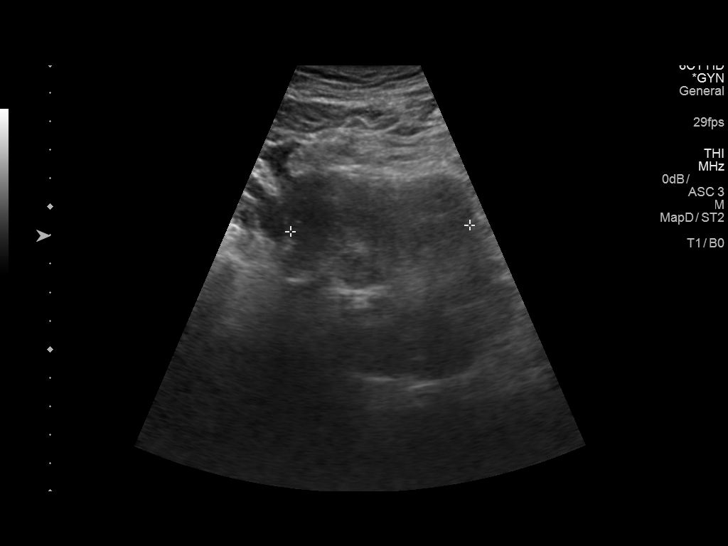
[im 8/46]
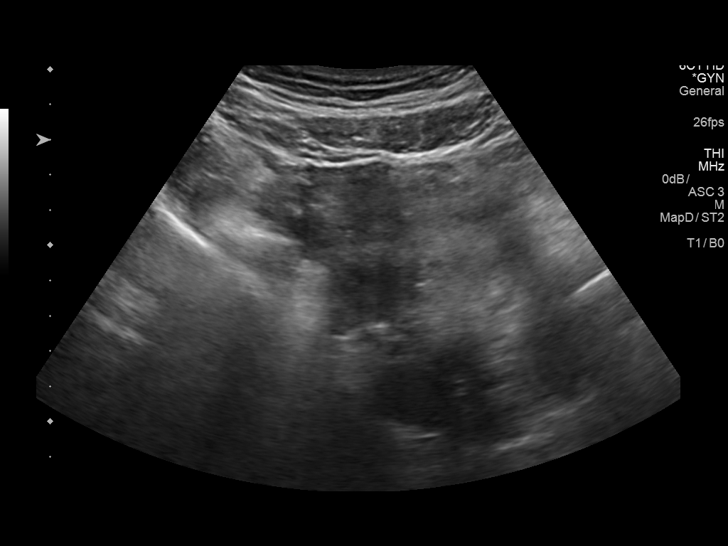
[im 12/46]
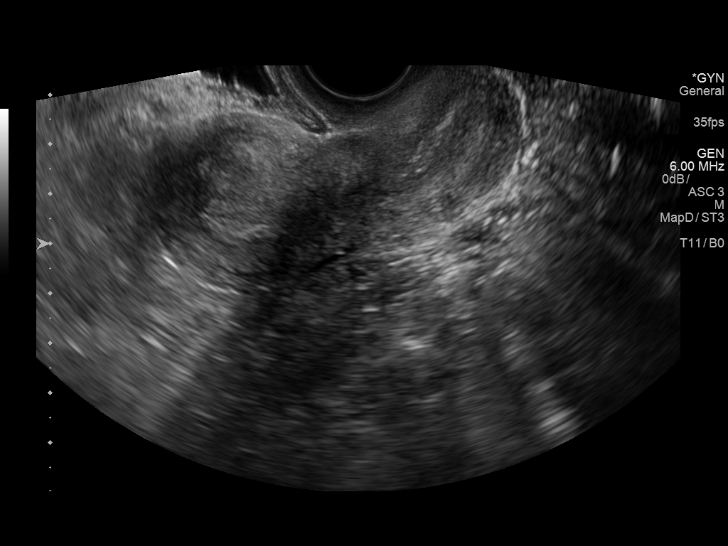
[im 16/46]
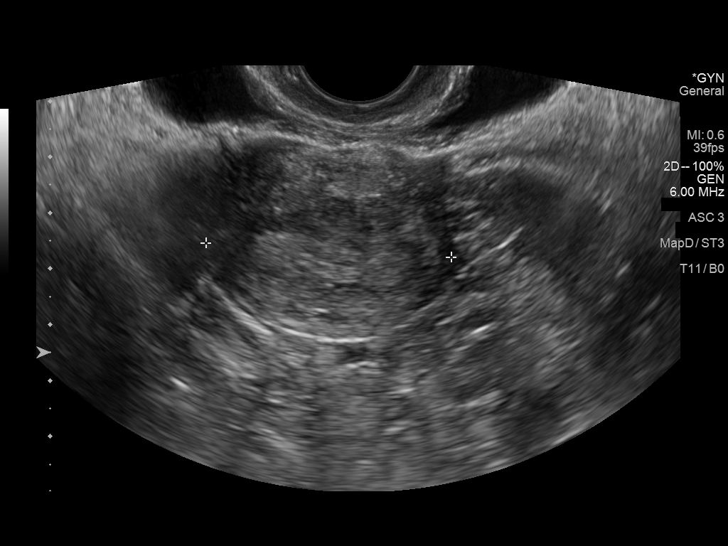
[im 17/46]
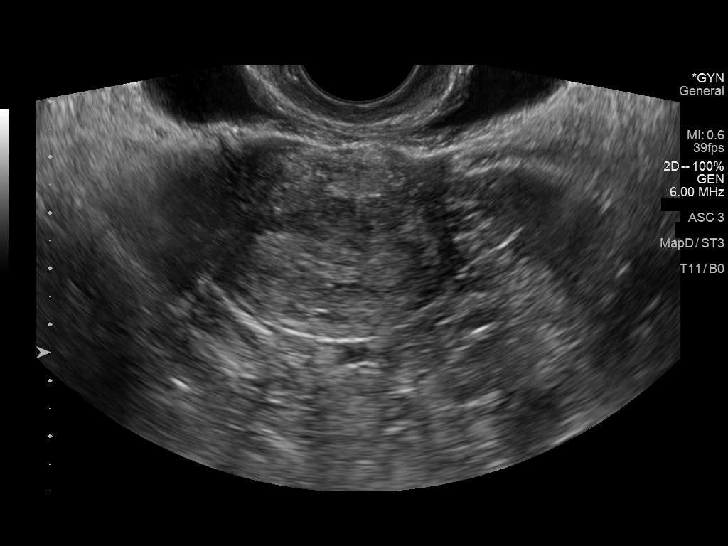
[im 21/46]
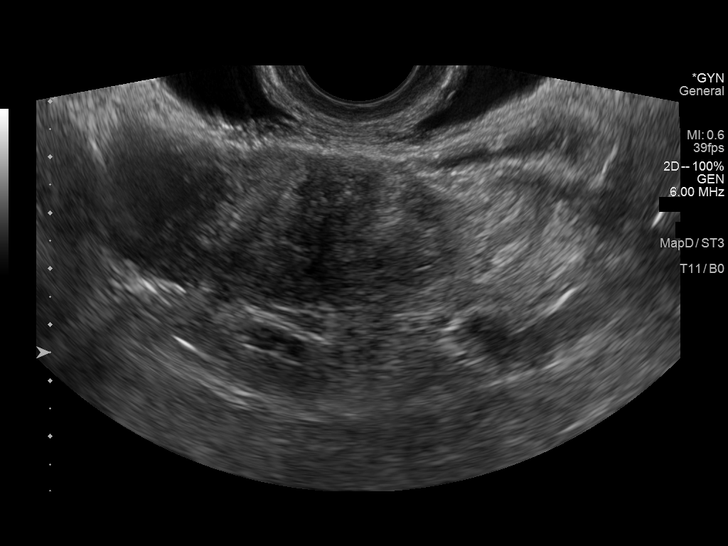
[im 25/46]
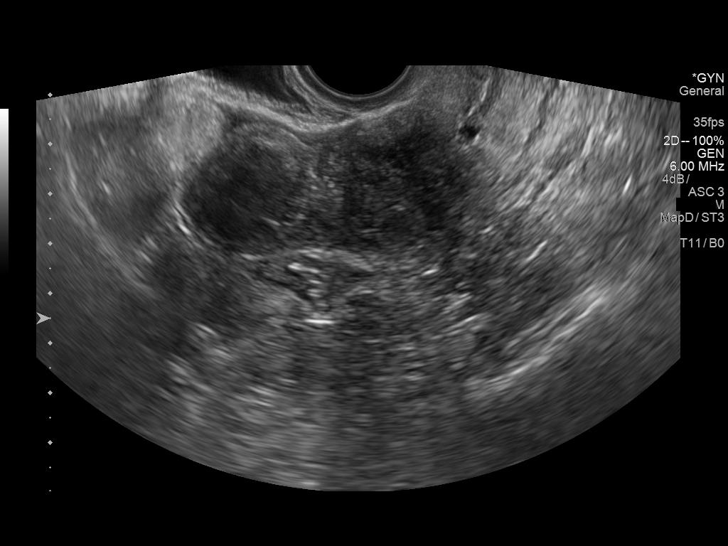
[im 29/46]
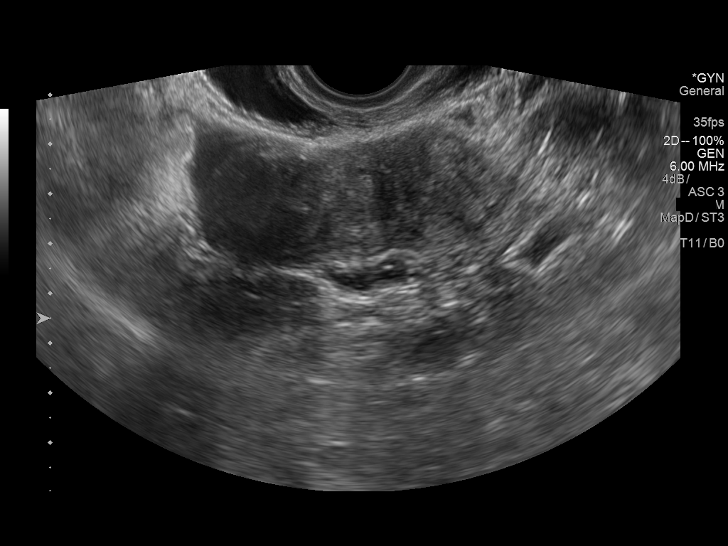
[im 31/46]
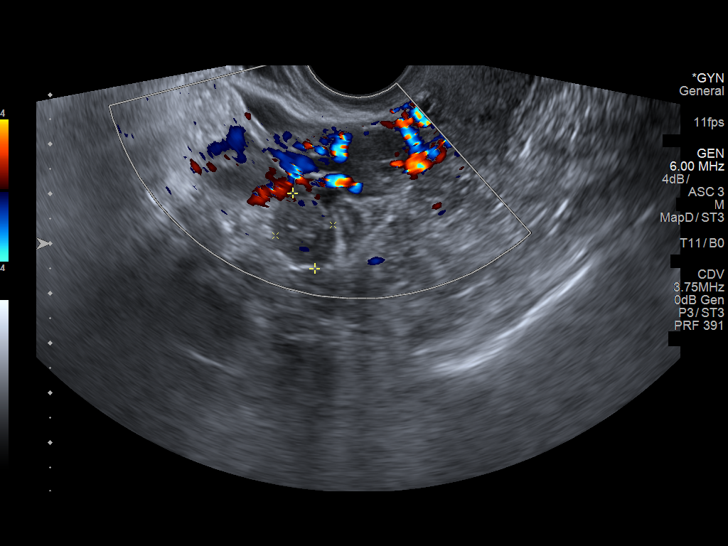
[im 34/46]
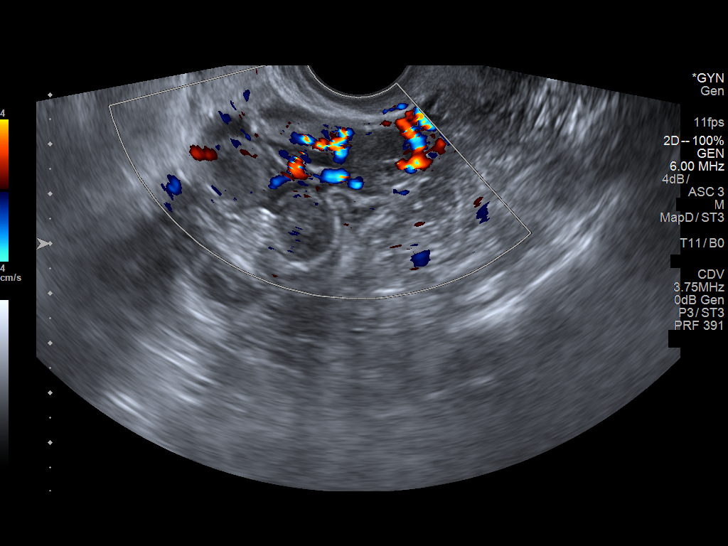
[im 38/46]
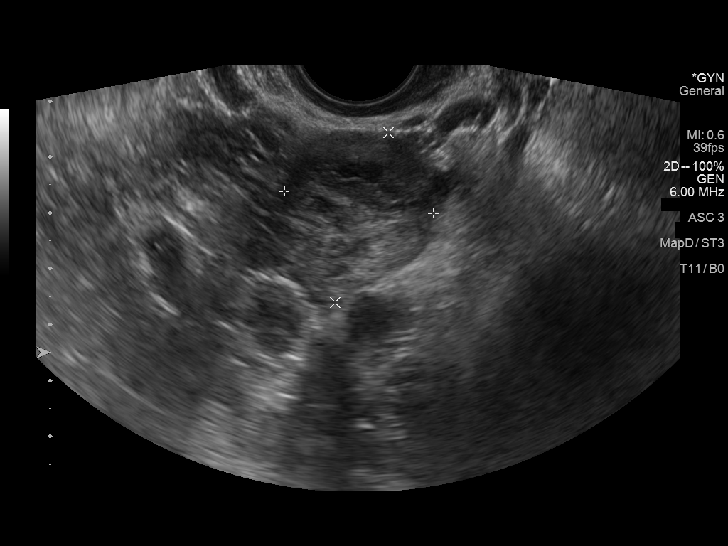
[im 42/46]
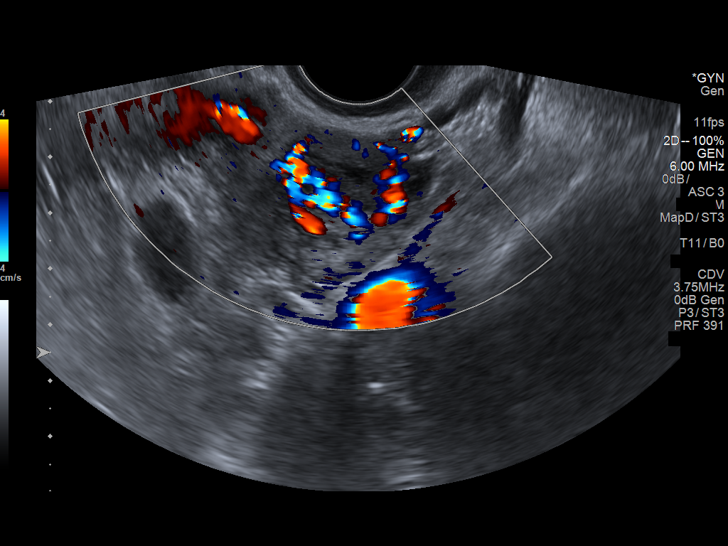
[im 46/46]
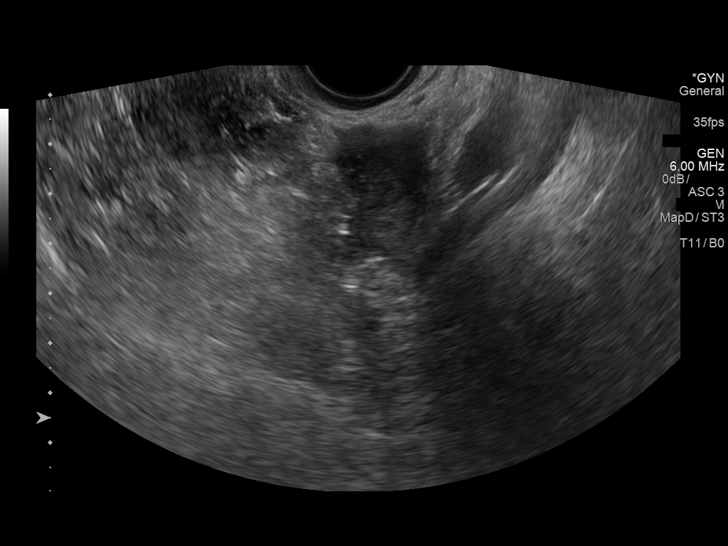

[14 of 25 positions shown; findings below may reference images not displayed]

FINDINGS: Uterus

Measurements: 7.7 x 3.6 x 4.4 cm.. 3 cm uterine fibroid right
fundus.

Endometrium

Thickness: 7.3 mm.  No focal abnormality visualized.

Right ovary

Measurements: 1.6 x 1.2 x 1.3 cm. Normal appearance/no adnexal mass.

Left ovary

Measurements: 2.7 x 3.2 x 2.1 cm. Heterogeneous left ovary without
mass or cyst.

Other findings

Minimal free fluid
IMPRESSION: 3 cm fibroid right uterine fundus

Heterogeneous left ovary without mass.  Minimal free fluid.

## 2019-03-21 ENCOUNTER — Other Ambulatory Visit: Payer: Self-pay | Admitting: Allergy

## 2019-04-10 ENCOUNTER — Other Ambulatory Visit: Payer: Self-pay | Admitting: Nurse Practitioner

## 2019-04-12 DIAGNOSIS — Z23 Encounter for immunization: Secondary | ICD-10-CM | POA: Diagnosis not present

## 2019-04-12 DIAGNOSIS — L209 Atopic dermatitis, unspecified: Secondary | ICD-10-CM | POA: Diagnosis not present

## 2019-04-12 DIAGNOSIS — Z79899 Other long term (current) drug therapy: Secondary | ICD-10-CM | POA: Diagnosis not present

## 2019-04-15 ENCOUNTER — Other Ambulatory Visit: Payer: Self-pay

## 2019-04-15 ENCOUNTER — Encounter: Payer: Self-pay | Admitting: Emergency Medicine

## 2019-04-15 ENCOUNTER — Ambulatory Visit
Admission: EM | Admit: 2019-04-15 | Discharge: 2019-04-15 | Disposition: A | Discharge status: Home / Self Care | Payer: 59 | Attending: Emergency Medicine | Admitting: Emergency Medicine

## 2019-04-15 DIAGNOSIS — L301 Dyshidrosis [pompholyx]: Secondary | ICD-10-CM

## 2019-04-15 MED ORDER — MICONAZOLE NITRATE 2 % EX AERP: 1.0000 "application " | INHALATION_SPRAY | Freq: Every day | CUTANEOUS | 0 refills | Status: DC

## 2019-04-15 NOTE — ED Provider Notes (Signed)
[facility]    CSN: [id number] Arrival date & time: 04/15/19  1141      History   Chief Complaint Chief Complaint  Patient presents with  . Rash    HPI Natalie Gilbert is a 44 y.o. female with history of allergies, eczema presenting for rash to bilateral hands, low back, armpits, groin.  States that this has been ongoing for months now: Most recently seen by her dermatologist on Friday.  States that she was seen in person and prescribed clobetasol and "something that begins with a T".  States she has not used these medications yet as she was hoping to get steroids as they have helped her in the past.  Patient is compliant with allergy medications as outlined below: Denies fever, arthralgias, myalgias, difficulty breathing, cough, chest pain.  Patient has been using hydrocortisone on hands without significant relief.  States that she has to wear gloves at work, unsure if this is contributing.   Past Medical History:  Diagnosis Date  . Depression   . Eczema   . Heart murmur     Patient Active Problem List   Diagnosis Date Noted  . Allergic contact dermatitis 08/27/2018  . Seasonal and perennial allergic rhinitis 08/27/2018  . Rash and other nonspecific skin eruption 07/09/2018  . 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 Other allergic rhinitis 07/31/2017  . Hypertriglyceridemia 05/14/2017  . Obesity (BMI 30-39.9) 05/19/2016  . Depressive disorder, not elsewhere classified 09/19/2012    History reviewed. No pertinent surgical history.  OB History   No obstetric history on file.      Home Medications    Prior to Admission medications   Medication Sig Start Date End Date Taking? Authorizing Provider  cetirizine (ZYRTEC) 10 MG tablet TAKE 1 TABLET BY MOUTH EVERY DAY 03/21/19   Ambs, Kathrine Cords, FNP  clobetasol cream (TEMOVATE) [id number] % Apply 1 application topically 2 (two) times daily. Daily for up to 14 days. 07/31/17   [place], Betty G, MD  Clobetasol Prop  Emollient Base (CLOBETASOL PROPIONATE E) 0.05 % emollient cream Apply topically BID 11/13/18   Hassell Done, Mary-Margaret, FNP  clotrimazole-betamethasone (LOTRISONE) cream APPLY 1 APPLICATION TOPICALLY 2 (TWO) TIMES DAILY. 06/28/16   Marletta Lor, MD  Crisaborole (EUCRISA) 2 % OINT Apply 1 application topically 2 (two) times a day. 07/09/18   Garnet Sierras, DO  desonide (DESOWEN) 0.05 % cream desonide 0.05 % topical cream  APPLY SPARINGLY AND RUB GENTLY INTO THE AFFECTED AREA(S) BY TOPICAL ROUTE 2 TIMES PER DAY    [provider]  dupilumab (DUPIXENT) 300 MG/2ML prefilled syringe Inject 600 mg (2 syringes) under the skin on day 1, then 300 mg (1 syringe) every other week starting on Day 15 as directed 08/23/18   Tresa Garter, MD  dupilumab (DUPIXENT) 300 MG/2ML prefilled syringe Inject 300 mg (1 syringe) under the skin every other week starting on day 15 01/30/19   Angelica Chessman E, MD  fluocinonide ointment (LIDEX) 0.05 % fluocinonide 0.05 % topical ointment  APPLY TO AFFECTED AREA OF HANDS TWICE A DAY FOR 14 DAYS    [provider]  fluticasone (FLONASE) 50 MCG/ACT nasal spray Place 1 spray into both nostrils 2 (two) times daily. 07/31/17   [place], Betty G, MD  hydrOXYzine (ATARAX/VISTARIL) 25 MG tablet 1 tablet 1 hour before bedtime as needed for itching. 07/09/18   Garnet Sierras, DO  Miconazole Nitrate 2 % AERP Apply 1 application topically daily. 04/15/19   Hall-Potvin, [place],  PA-C  MICROGESTIN FE 1/20 1-20 MG-MCG tablet  09/19/12   [provider]    Family History Family History  Problem Relation Age of Onset  . Hypertension Mother   . Heart murmur Father   . Hypertension Brother   . Diabetes Maternal Grandmother   . Breast cancer Paternal Grandmother     Social History Social History   Tobacco Use  . Smoking status: Never Smoker  . Smokeless tobacco: Never Used  Substance Use Topics  . Alcohol use: No  . Drug use: No     Allergies   Patient  has no known allergies.   Review of Systems As per HPI   Physical Exam Triage Vital Signs ED Triage Vitals  Enc Vitals Group     BP      Pulse      Resp      Temp      Temp src      SpO2      Weight      Height      Head Circumference      Peak Flow      Pain Score      Pain Loc      Pain Edu?      Excl. in [place]?    No data found.  Updated Vital Signs BP (!) 135/91 (BP Location: Left Arm)   Pulse 75   Temp (!) 97.4 F (36.3 C) (Temporal)   Resp 16   SpO2 98%   Visual Acuity Right Eye Distance:   Left Eye Distance:   Bilateral Distance:    Right Eye Near:   Left Eye Near:    Bilateral Near:     Physical Exam Constitutional:      General: She is not in acute distress. HENT:     Head: Normocephalic and atraumatic.  Eyes:     General: No scleral icterus.    Pupils: Pupils are equal, round, and reactive to light.  Cardiovascular:     Rate and Rhythm: Normal rate.  Pulmonary:     Effort: Pulmonary effort is normal.  Genitourinary:    Comments: deferred Skin:    Coloration: Skin is not jaundiced or pale.     Findings: Rash present.     Comments: Dyshidrotic eczema noted to fingers bilaterally without open wound, pustules, TTP.  Patient does have hyperpigmentation over left axilla without overlying erythema, eczematous lesions.  No lesions identified on lumbar spine.  Neurological:     Mental Status: She is alert and oriented to person, place, and time.      UC Treatments / Results  Labs (all labs ordered are listed, but only abnormal results are displayed) Labs Reviewed - No data to display  EKG   Radiology No results found.  Procedures Procedures (including critical care time)  Medications Ordered in UC Medications - No data to display  Initial Impression / Assessment and Plan / UC Course  I have reviewed the triage vital signs and the nursing notes.  Pertinent labs & imaging results that were available during my care of the patient  were reviewed by me and considered in my medical decision making (see chart for details).     Patient afebrile, nontoxic.  Dyshidrotic eczema noted to hands, otherwise likely resolving eczematous rash on axilla, low back, groin.  Will prescribe miconazole for groin area to keep dry, help prevent yeast.  Clinically case is mild at this time: Discussed that I agree with patient's dermatologist  and trying to avoid systemic steroid therapy at this time.  Spent time counseling on ADRs and long-term side effects of systemic steroid use as well as topical steroid use.  Patient verbalized understanding: Was hoping to get a steroid shot, though understanding why she is not receiving that today.  Will work on compliance with clobetasol, moisturizing hands at night, and avoiding skin irritants such as perfumes, dyes, warm water.  Continue follow-up with dermatology/PCP.  Return precautions discussed, patient verbalized understanding and is agreeable to plan. Final Clinical Impressions(s) / UC Diagnoses   Final diagnoses:  Dyshidrotic eczema     Discharge Instructions     Continue allergy medication. Use clobetasol 2 times daily x 1 week and follow up with dermatologist. Return for worsening rash, swelling, fever.    ED Prescriptions    Medication Sig Dispense Auth. Provider   Miconazole Nitrate 2 % AERP Apply 1 application topically daily. 130 g Hall-Potvin, [place], PA-C     PDMP not reviewed this encounter.   Hall-Potvin, [place], [place] 04/15/19 1328

## 2019-04-15 NOTE — ED Notes (Signed)
Patient able to ambulate independently  

## 2019-04-15 NOTE — ED Triage Notes (Signed)
Pt presents to [facility] for assessment of rash to hands, back, arm pits, groin and legs.  Seen Friday at dermatologist and given a cream, but patient states she wants something "internally" to help with full body rash.  States she asked about a cortisone shot at the dermatologist and she states "it went over their head".

## 2019-04-15 NOTE — Discharge Instructions (Addendum)
Continue allergy medication. Use clobetasol 2 times daily x 1 week and follow up with dermatologist. Return for worsening rash, swelling, fever.

## 2019-04-25 MED FILL — DUPIXENT 300 MG/2 ML SAFE S: 300 | 28 days supply | Qty: 4 | Fill #0

## 2019-05-22 ENCOUNTER — Ambulatory Visit: Payer: 59 | Admitting: Family Medicine

## 2019-05-22 MED FILL — DUPIXENT 300 MG/2 ML SAFE S: 300 | 28 days supply | Qty: 4 | Fill #1

## 2019-05-28 DIAGNOSIS — Z6830 Body mass index (BMI) 30.0-30.9, adult: Secondary | ICD-10-CM | POA: Diagnosis not present

## 2019-05-28 DIAGNOSIS — Z3041 Encounter for surveillance of contraceptive pills: Secondary | ICD-10-CM | POA: Diagnosis not present

## 2019-05-28 DIAGNOSIS — Z1389 Encounter for screening for other disorder: Secondary | ICD-10-CM | POA: Diagnosis not present

## 2019-05-28 DIAGNOSIS — Z1231 Encounter for screening mammogram for malignant neoplasm of breast: Secondary | ICD-10-CM | POA: Diagnosis not present

## 2019-05-28 DIAGNOSIS — Z202 Contact with and (suspected) exposure to infections with a predominantly sexual mode of transmission: Secondary | ICD-10-CM | POA: Diagnosis not present

## 2019-05-28 DIAGNOSIS — Z130 Encounter for screening for diseases of the blood and blood-forming organs and certain disorders involving the immune mechanism: Secondary | ICD-10-CM | POA: Diagnosis not present

## 2019-05-28 DIAGNOSIS — Z01419 Encounter for gynecological examination (general) (routine) without abnormal findings: Secondary | ICD-10-CM | POA: Diagnosis not present

## 2019-05-29 ENCOUNTER — Ambulatory Visit: Payer: 59 | Admitting: Family Medicine

## 2019-06-03 ENCOUNTER — Other Ambulatory Visit: Payer: Self-pay

## 2019-06-04 ENCOUNTER — Ambulatory Visit (INDEPENDENT_AMBULATORY_CARE_PROVIDER_SITE_OTHER): Payer: 59 | Admitting: Family Medicine

## 2019-06-04 ENCOUNTER — Encounter: Payer: Self-pay | Admitting: Family Medicine

## 2019-06-04 ENCOUNTER — Other Ambulatory Visit: Payer: Self-pay

## 2019-06-04 VITALS — BP 132/80 | HR 86 | Temp 95.6°F | Resp 12 | Ht 64.25 in | Wt 186.0 lb

## 2019-06-04 DIAGNOSIS — Z23 Encounter for immunization: Secondary | ICD-10-CM

## 2019-06-04 DIAGNOSIS — Z13228 Encounter for screening for other metabolic disorders: Secondary | ICD-10-CM | POA: Diagnosis not present

## 2019-06-04 DIAGNOSIS — Z111 Encounter for screening for respiratory tuberculosis: Secondary | ICD-10-CM

## 2019-06-04 DIAGNOSIS — Z1329 Encounter for screening for other suspected endocrine disorder: Secondary | ICD-10-CM | POA: Diagnosis not present

## 2019-06-04 DIAGNOSIS — Z130 Encounter for screening for diseases of the blood and blood-forming organs and certain disorders involving the immune mechanism: Secondary | ICD-10-CM | POA: Diagnosis not present

## 2019-06-04 DIAGNOSIS — E785 Hyperlipidemia, unspecified: Secondary | ICD-10-CM | POA: Diagnosis not present

## 2019-06-04 DIAGNOSIS — Z0000 Encounter for general adult medical examination without abnormal findings: Secondary | ICD-10-CM

## 2019-06-04 LAB — HEMOGLOBIN A1C: Hgb A1c MFr Bld: 5.4 % (ref 4.6–6.5)

## 2019-06-04 LAB — LIPID PANEL
Cholesterol: 211 mg/dL — ABNORMAL HIGH (ref 0–200)
HDL: 48 mg/dL (ref >39.00)
LDL Cholesterol: 126 mg/dL — ABNORMAL HIGH (ref 0–99)
NonHDL: 163.31
Total CHOL/HDL Ratio: 4
Triglycerides: 187 mg/dL — ABNORMAL HIGH (ref 0.0–149.0)
VLDL: 37.4 mg/dL (ref 0.0–40.0)

## 2019-06-04 LAB — BASIC METABOLIC PANEL
BUN: 12 mg/dL (ref 6–23)
CO2: 25 mEq/L (ref 19–32)
Calcium: 9.5 mg/dL (ref 8.4–10.5)
Chloride: 104 mEq/L (ref 96–112)
Creatinine, Ser: 0.83 mg/dL (ref 0.40–1.20)
GFR: 90.93 mL/min (ref >60.00)
Glucose, Bld: 91 mg/dL (ref 70–99)
Potassium: 4.6 mEq/L (ref 3.5–5.1)
Sodium: 135 mEq/L (ref 135–145)

## 2019-06-04 NOTE — Progress Notes (Signed)
HPI:  [name] is a 44 y.o. female, who is here today for her routine physical.  Last CPE: 10/01/18  Regular exercise 3 or more time per week: Not consistently. Following a healthy diet: For the past 2 weeks decreased meet and fried food intake. She lives alone.  Chronic medical problems: HLD mixed,allergies,eczema (follows with dermatologist). She follows with dermatologist for contact dermatitis.  Pap smear: Last week, she sees her gynecologist regularly, Dr. Willis Gilbert.  Immunization History  Administered Date(s) Administered  . Tdap 10/19/2012, 06/30/2016   Mammogram: Last week at her gyn's office. Colonoscopy: N/A DEXA: N/A  Concerns today: She needs a form completed in order to go back to school.  HLD: She is on non pharmacologic treatment.  Component     Latest Ref Rng & Units 10/01/2018  Cholesterol     0 - 200 mg/dL 222 (H)  Triglycerides     0.0 - 149.0 mg/dL 174.0 (H)  HDL Cholesterol     >39.00 mg/dL 51.40  VLDL     0.0 - 40.0 mg/dL 34.8  LDL (calc)     0 - 99 mg/dL 136 (H)  Total CHOL/HDL Ratio      4  NonHDL      171.03    Review of Systems  Constitutional: Negative for appetite change, fatigue and fever.  HENT: Negative for dental problem, hearing loss, mouth sores and sore throat.   Eyes: Negative for redness and visual disturbance.  Respiratory: Negative for cough, shortness of breath and wheezing.   Cardiovascular: Negative for chest pain and leg swelling.  Gastrointestinal: Negative for abdominal pain, nausea and vomiting.       No changes in bowel habits.  Endocrine: Negative for cold intolerance, heat intolerance, polydipsia, polyphagia and polyuria.  Genitourinary: Negative for decreased urine volume, dysuria, hematuria, vaginal bleeding and vaginal discharge.  Musculoskeletal: Negative for gait problem and myalgias.  Skin: Negative for color change and rash.  Allergic/Immunologic: Positive for environmental allergies.    Neurological: Negative for syncope, weakness and headaches.  Hematological: Negative for adenopathy. Does not bruise/bleed easily.  Psychiatric/Behavioral: Negative for confusion and sleep disturbance. The patient is not nervous/anxious.   All other systems reviewed and are negative.  Current Outpatient Medications on File Prior to Visit  Medication Sig Dispense Refill  . cetirizine (ZYRTEC) 10 MG tablet TAKE 1 TABLET BY MOUTH EVERY DAY 30 tablet 0  . desonide (DESOWEN) 0.05 % cream desonide 0.05 % topical cream  APPLY SPARINGLY AND RUB GENTLY INTO THE AFFECTED AREA(S) BY TOPICAL ROUTE 2 TIMES PER DAY    . dupilumab (DUPIXENT) 300 MG/2ML prefilled syringe Inject 600 mg (2 syringes) under the skin on day 1, then 300 mg (1 syringe) every other week starting on Day 15 as directed 4 mL 1  . dupilumab (DUPIXENT) 300 MG/2ML prefilled syringe Inject 300 mg (1 syringe) under the skin every other week starting on day 15 4 mL 1  . fluocinonide ointment (LIDEX) 0.05 % fluocinonide 0.05 % topical ointment  APPLY TO AFFECTED AREA OF HANDS TWICE A DAY FOR 14 DAYS    . MICROGESTIN FE 1/20 1-20 MG-MCG tablet     . triamcinolone ointment (KENALOG) 0.1 % Apply 1 application topically 2 (two) times daily.    . clobetasol cream (TEMOVATE) 1.22 % Apply 1 application topically 2 (two) times daily. Daily for up to 14 days. (Patient not taking: Reported on 06/04/2019) 45 g 1  . Clobetasol Prop Emollient Base (CLOBETASOL  PROPIONATE E) 0.05 % emollient cream Apply topically BID (Patient not taking: Reported on 06/04/2019) 30 g 0  . clotrimazole-betamethasone (LOTRISONE) cream APPLY 1 APPLICATION TOPICALLY 2 (TWO) TIMES DAILY. (Patient not taking: Reported on 06/04/2019) 45 g 0  . Crisaborole (EUCRISA) 2 % OINT Apply 1 application topically 2 (two) times a day. (Patient not taking: Reported on 06/04/2019) 100 g 3  . fluticasone (FLONASE) 50 MCG/ACT nasal spray Place 1 spray into both nostrils 2 (two) times daily. (Patient  not taking: Reported on 06/04/2019) 16 g 3  . hydrOXYzine (ATARAX/VISTARIL) 25 MG tablet 1 tablet 1 hour before bedtime as needed for itching. (Patient not taking: Reported on 06/04/2019) 30 tablet 5  . Miconazole Nitrate 2 % AERP Apply 1 application topically daily. (Patient not taking: Reported on 06/04/2019) 130 g 0   No current facility-administered medications on file prior to visit.    Past Medical History:  Diagnosis Date  . Depression   . Eczema   . Heart murmur    History reviewed. No pertinent surgical history.  No Known Allergies  Family History  Problem Relation Age of Onset  . Hypertension Mother   . Heart murmur Father   . Hypertension Brother   . Diabetes Maternal Grandmother   . Breast cancer Paternal Grandmother     Social History   Socioeconomic History  . Marital status: Single    Spouse name: Not on file  . Number of children: Not on file  . Years of education: Not on file  . Highest education level: Not on file  Occupational History  . Not on file  Tobacco Use  . Smoking status: Never Smoker  . Smokeless tobacco: Never Used  Substance and Sexual Activity  . Alcohol use: No  . Drug use: No  . Sexual activity: Yes    Birth control/protection: Spermicide  Other Topics Concern  . Not on file  Social History Narrative  . Not on file   Social Determinants of Health   Financial Resource Strain:   . Difficulty of Paying Living Expenses:   Food Insecurity:   . Worried About Charity fundraiser in the Last Year:   . Arboriculturist in the Last Year:   Transportation Needs:   . Film/video editor (Medical):   Marland Kitchen Lack of Transportation (Non-Medical):   Physical Activity:   . Days of Exercise per Week:   . Minutes of Exercise per Session:   Stress:   . Feeling of Stress :   Social Connections:   . Frequency of Communication with Friends and Family:   . Frequency of Social Gatherings with Friends and Family:   . Attends Religious Services:    . Active Member of Clubs or Organizations:   . Attends Archivist Meetings:   Marland Kitchen Marital Status:    Vitals:   06/04/19 0804  BP: 132/80  Pulse: 86  Resp: 12  Temp: (!) 95.6 F (35.3 C)  SpO2: 98%   Body mass index is 31.68 kg/m.  Wt Readings from Last 3 Encounters:  06/04/19 186 lb (84.4 kg)  10/01/18 183 lb 8 oz (83.2 kg)  07/09/18 179 lb 6.4 oz (81.4 kg)   Physical Exam  Nursing note and vitals reviewed. Constitutional: She is oriented to person, place, and time. She appears well-developed. No distress.  HENT:  Head: Normocephalic and atraumatic.  Right Ear: Hearing, tympanic membrane, external ear and ear canal normal.  Left Ear: Hearing, tympanic membrane, external ear  and ear canal normal.  Mouth/Throat: Uvula is midline, oropharynx is clear and moist and mucous membranes are normal.  Eyes: Pupils are equal, round, and reactive to light. Conjunctivae and EOM are normal.  Neck: No tracheal deviation present. No thyromegaly present.  Cardiovascular: Normal rate and regular rhythm.  No murmur heard. Pulses:      Dorsalis pedis pulses are 2+ on the right side and 2+ on the left side.  Respiratory: Effort normal and breath sounds normal. No respiratory distress.  GI: Soft. She exhibits no mass. There is no hepatomegaly. There is no abdominal tenderness.  Genitourinary:    Genitourinary Comments: Deferred to gyn.   Musculoskeletal:        General: No edema.     Comments: No major deformity or signs of synovitis appreciated.  Lymphadenopathy:    She has no cervical adenopathy.       Right: No supraclavicular adenopathy present.       Left: No supraclavicular adenopathy present.  Neurological: She is alert and oriented to person, place, and time. She has normal strength. No cranial nerve deficit. Coordination and gait normal.  Reflex Scores:      Bicep reflexes are 2+ on the right side and 2+ on the left side.      Patellar reflexes are 2+ on the right side  and 2+ on the left side. Skin: Skin is warm. No rash noted. No erythema.  Psychiatric: She has a normal mood and affect. Cognition and memory are normal.  Well groomed, good eye contact.    ASSESSMENT AND PLAN:  Ms. Natalie Gilbert was here today annual physical examination.  Orders Placed This Encounter  Procedures  . MMR vaccine subcutaneous  . Hemoglobin A1c  . Lipid panel  . Basic metabolic panel   Lab Results  Component Value Date   CHOL 211 (H) 06/04/2019   HDL 48.00 06/04/2019   LDLCALC 126 (H) 06/04/2019   LDLDIRECT 87.0 07/15/2016   TRIG 187.0 (H) 06/04/2019   CHOLHDL 4 06/04/2019   Lab Results  Component Value Date   HGBA1C 5.4 06/04/2019   Lab Results  Component Value Date   CREATININE 0.83 06/04/2019   BUN 12 06/04/2019   NA 135 06/04/2019   K 4.6 06/04/2019   CL 104 06/04/2019   CO2 25 06/04/2019    Routine general medical examination at a health care facility We discussed the importance of regular physical activity and healthy diet for prevention of chronic illness and/or complications. Preventive guidelines reviewed. Vaccination updated. Continue following with gyn for her female preventive care.  Next CPE in a year.  The 10-year ASCVD risk score Mikey Bussing DC Brooke Bonito., et al., 2013) is: 1.2%   Values used to calculate the score:     Age: 28 years     Sex: Female     Is Non-Hispanic African American: Yes     Diabetic: No     Tobacco smoker: No     Systolic Blood Pressure: 630 mmHg     Is BP treated: No     HDL Cholesterol: 48 mg/dL     Total Cholesterol: 211 mg/dL  Hyperlipidemia, unspecified hyperlipidemia type Continue non pharmacologic treatment, will follow labs done today and will give further recommendations accordingly.  Screening for endocrine, metabolic and immunity disorder -     Hemoglobin A1c -     Basic metabolic panel  Screening-pulmonary TB -     Cancel: QuantiFERON-TB Gold Plus  Other orders -  MMR vaccine  subcutaneous  Form completed. She has all vaccines that are required and varicella ab +.   Return in 1 year (on 06/03/2020) for CPE.   Natalie Gilbert G. Martinique, MD  [facility]. [place] office.   Today you have you routine preventive visit.  At least 150 minutes of moderate exercise per week, daily brisk walking for 15-30 min is a good exercise option. Healthy diet low in saturated (animal) fats and sweets and consisting of fresh fruits and vegetables, lean meats such as fish and white chicken and whole grains.  These are some of recommendations for screening depending of age and risk factors:  - Vaccines:  Tdap vaccine every 10 years.  Shingles vaccine recommended at age 63, could be given after 43 years of age but not sure about insurance coverage.   Pneumonia vaccines: Pneumovax at 104. Sometimes Pneumovax is giving earlier if history of smoking, lung disease,diabetes,kidney disease among some.  Screening for diabetes at age 47 and every 3 years.  Cervical cancer prevention:  Pap smear starts at 43 years of age and continues periodically until 43 years old in low risk women. Pap smear every 3 years between 75 and 53 years old. Pap smear every 3-5 years between women 65 and older if pap smear negative and HPV screening negative.   -Breast cancer: Mammogram: There is disagreement between experts about when to start screening in low risk asymptomatic female but recent recommendations are to start screening at 72 and not later than 43 years old , every 1-2 years and after 43 yo q 2 years. Screening is recommended until 43 years old but some women can continue screening depending of healthy issues.  Colon cancer screening: starts at 43 years old until 43 years old.  Cholesterol disorder screening at age 1 and every 3 years. N/A  Also recommended:  1. Dental visit- Brush and floss your teeth twice daily; visit your dentist twice a year. 2. Eye doctor- Get an eye exam at  least every 2 years. 3. Helmet use- Always wear a helmet when riding a bicycle, motorcycle, rollerblading or skateboarding. 4. Safe sex- If you may be exposed to sexually transmitted infections, use a condom. 5. Seat belts- Seat belts can save your live; always wear one. 6. Smoke/Carbon Monoxide detectors- These detectors need to be installed on the appropriate level of your home. Replace batteries at least once a year. 7. Skin cancer- When out in the sun please cover up and use sunscreen 15 SPF or higher. 8. Violence- If anyone is threatening or hurting you, please tell your healthcare provider.  9. Drink alcohol in moderation- Limit alcohol intake to one drink or less per day. Never drink and drive.

## 2019-06-04 NOTE — Patient Instructions (Signed)
Today you have you routine preventive visit.  At least 150 minutes of moderate exercise per week, daily brisk walking for 15-30 min is a good exercise option. Healthy diet low in saturated (animal) fats and sweets and consisting of fresh fruits and vegetables, lean meats such as fish and white chicken and whole grains.  These are some of recommendations for screening depending of age and risk factors:  - Vaccines:  Tdap vaccine every 10 years.  Shingles vaccine recommended at age 28, could be given after 43 years of age but not sure about insurance coverage.   Pneumonia vaccines: Pneumovax at 75. Sometimes Pneumovax is giving earlier if history of smoking, lung disease,diabetes,kidney disease among some.  Screening for diabetes at age 28 and every 3 years.  Cervical cancer prevention:  Pap smear starts at 44 years of age and continues periodically until 43 years old in low risk women. Pap smear every 3 years between 51 and 25 years old. Pap smear every 3-5 years between women 28 and older if pap smear negative and HPV screening negative.   -Breast cancer: Mammogram: There is disagreement between experts about when to start screening in low risk asymptomatic female but recent recommendations are to start screening at 59 and not later than 43 years old , every 1-2 years and after 43 yo q 2 years. Screening is recommended until 43 years old but some women can continue screening depending of healthy issues.  Colon cancer screening: starts at 43 years old until 43 years old.  Cholesterol disorder screening at age 34 and every 3 years. N/A  Also recommended:  1. Dental visit- Brush and floss your teeth twice daily; visit your dentist twice a year. 2. Eye doctor- Get an eye exam at least every 2 years. 3. Helmet use- Always wear a helmet when riding a bicycle, motorcycle, rollerblading or skateboarding. 4. Safe sex- If you may be exposed to sexually transmitted infections, use a  condom. 5. Seat belts- Seat belts can save your live; always wear one. 6. Smoke/Carbon Monoxide detectors- These detectors need to be installed on the appropriate level of your home. Replace batteries at least once a year. 7. Skin cancer- When out in the sun please cover up and use sunscreen 15 SPF or higher. 8. Violence- If anyone is threatening or hurting you, please tell your healthcare provider.  9. Drink alcohol in moderation- Limit alcohol intake to one drink or less per day. Never drink and drive.

## 2019-06-12 ENCOUNTER — Other Ambulatory Visit: Payer: Self-pay | Admitting: Pharmacist

## 2019-06-12 MED ORDER — DUPIXENT 300 MG/2ML ~~LOC~~ SOSY: PREFILLED_SYRINGE | SUBCUTANEOUS | 1 refills | Status: DC

## 2019-06-13 MED FILL — DUPIXENT 300 MG/2 ML SAFE S: 300 | 28 days supply | Qty: 4 | Fill #0

## 2019-06-14 ENCOUNTER — Telehealth: Payer: Self-pay | Admitting: Family Medicine

## 2019-06-14 NOTE — Telephone Encounter (Signed)
Form placed on provider's desk for signature.  

## 2019-06-14 NOTE — Telephone Encounter (Signed)
Pt will be dropping off her immunization paper. She is needing her PCP signature, stamped signature, or a letter head of the PCP to confirm this is her real immunization list. Did inform the pt to fill out our form completion when she arrives to drop it off. Sending note as a FYI but pt did state she needs it before 5/13

## 2019-06-14 NOTE — Telephone Encounter (Signed)
Pt came in dropped off Immunization Summary form to be completed by the provider. Upon completion pt would like to be called at [phone] to pick form up. Placed in providers folder. Thanks

## 2019-06-18 NOTE — Telephone Encounter (Signed)
Form completed. Patient informed and stated that she would pick up today.

## 2019-06-25 ENCOUNTER — Other Ambulatory Visit: Payer: Self-pay

## 2019-06-26 ENCOUNTER — Ambulatory Visit (INDEPENDENT_AMBULATORY_CARE_PROVIDER_SITE_OTHER): Payer: 59

## 2019-06-26 DIAGNOSIS — Z111 Encounter for screening for respiratory tuberculosis: Secondary | ICD-10-CM

## 2019-06-28 DIAGNOSIS — Z111 Encounter for screening for respiratory tuberculosis: Secondary | ICD-10-CM

## 2019-06-28 LAB — TB SKIN TEST
Induration: 0 mm
TB Skin Test: NEGATIVE

## 2019-06-28 NOTE — Progress Notes (Signed)
Per orders of Dr. Martinique, pt present for Tb skin test by Franco Collet. Patient tolerated injection well.

## 2019-07-04 ENCOUNTER — Telehealth: Payer: Self-pay | Admitting: Family Medicine

## 2019-07-04 NOTE — Telephone Encounter (Signed)
  pt  wants to know if she can have a  TSpot  and  TB  and  QuantiFERON-TB Gold test because her first one was efficient enough for school, please call [phone]

## 2019-07-04 NOTE — Telephone Encounter (Signed)
Patient is wanting to know if she her can have the QuantiFERON-TB Gold test done here since her TB skin test wasn't efficient for school.  Please advise

## 2019-07-05 ENCOUNTER — Ambulatory Visit: Payer: 59 | Admitting: Family Medicine

## 2019-07-08 ENCOUNTER — Other Ambulatory Visit: Payer: Self-pay | Admitting: Family Medicine

## 2019-07-08 DIAGNOSIS — Z111 Encounter for screening for respiratory tuberculosis: Secondary | ICD-10-CM

## 2019-07-08 NOTE — Telephone Encounter (Signed)
It is okay to have QuantiFERON TB Gold test done here in the office. Order was placed. Thanks, BJ

## 2019-07-08 NOTE — Telephone Encounter (Signed)
Please advise 

## 2019-07-09 NOTE — Telephone Encounter (Signed)
Noted  

## 2019-07-09 NOTE — Telephone Encounter (Signed)
Spoke with patient. Patient reports she no longer needs this test done.

## 2019-07-15 MED FILL — DUPIXENT 300 MG/2 ML SAFE S: 300 | 28 days supply | Qty: 4 | Fill #1

## 2019-08-12 ENCOUNTER — Other Ambulatory Visit: Payer: Self-pay | Admitting: Pharmacist

## 2019-08-12 MED ORDER — DUPIXENT 300 MG/2ML ~~LOC~~ SOSY: PREFILLED_SYRINGE | SUBCUTANEOUS | 1 refills | Status: DC

## 2019-08-12 MED FILL — DUPIXENT 300 MG/2 ML SAFE S: 300 | 28 days supply | Qty: 4 | Fill #0

## 2019-08-23 ENCOUNTER — Other Ambulatory Visit: Payer: Self-pay | Admitting: Family Medicine

## 2019-08-23 ENCOUNTER — Other Ambulatory Visit: Payer: Self-pay | Admitting: Nurse Practitioner

## 2019-09-02 ENCOUNTER — Other Ambulatory Visit: Payer: Self-pay

## 2019-09-02 ENCOUNTER — Ambulatory Visit (HOSPITAL_BASED_OUTPATIENT_CLINIC_OR_DEPARTMENT_OTHER): Payer: 59 | Admitting: Pharmacist

## 2019-09-02 DIAGNOSIS — Z79899 Other long term (current) drug therapy: Secondary | ICD-10-CM

## 2019-09-02 NOTE — Progress Notes (Signed)
   S: Patient presents for review of their specialty medication therapy.  Patient is currently taking Dupixent for atopic dermatitis. Patient is managed by Dr. Renda Rolls for this.   Adherence: reported  Efficacy: pt reports that it works well overall but continues to use topical medication for her hands.   Dosing: 300 mg every 14 days  Dose adjustments: Renal: no dose adjustments (has not been studied) Hepatic: no dose adjustments (has not been studied)  Screening: TB test: not on file  Monitoring: S/sx of infection: denies S/sx of hypersensitivity: has not started medication yet S/sx of ocular effects: has not started medication yet S/sx of eosinophilia/vasculitis: has not started medication yet  O:     Lab Results  Component Value Date   WBC 12.8 (H) 11/24/2014   HGB 12.1 11/24/2014   HCT 37.0 11/24/2014   MCV 87.0 11/24/2014   PLT 429.0 (H) 11/24/2014      Chemistry      Component Value Date/Time   NA 135 06/04/2019 0955   K 4.6 06/04/2019 0955   CL 104 06/04/2019 0955   CO2 25 06/04/2019 0955   BUN 12 06/04/2019 0955   CREATININE 0.83 06/04/2019 0955      Component Value Date/Time   CALCIUM 9.5 06/04/2019 0955   ALKPHOS 55 10/28/2014 1318   AST 23 10/28/2014 1318   ALT 19 10/28/2014 1318   BILITOT 0.3 10/28/2014 1318       A/P: 1. Medication review: Patient currently on [place] for atopic dermatitis. Reviewed the medication with the patient, including the following: Dupixent is a monoclonal antibody used for the treatment of asthma or atopic dermatitis. Patient educated on purpose, proper use and potential adverse effects of Dupixent. Possible adverse effects include increased risk of infection, ocular effects, vasculitis/eosinophilia, and hypersensitivity reactions. Administer as a SubQ injection and rotate sites. Allow the medication to reach room temp prior to administration (45 mins for 300 mg syringe or 30 min for 200 mg syringe). Do not shake.  Discard any unused portion. No recommendations for any changes.  Benard Halsted, PharmD, [place] [phone]

## 2019-09-10 MED FILL — DUPIXENT 300 MG/2 ML SAFE S: 300 | 28 days supply | Qty: 4 | Fill #1

## 2019-09-18 ENCOUNTER — Encounter: Payer: Self-pay | Admitting: Family Medicine

## 2019-09-18 ENCOUNTER — Other Ambulatory Visit: Payer: Self-pay

## 2019-09-18 ENCOUNTER — Ambulatory Visit: Payer: 59 | Admitting: Family Medicine

## 2019-09-18 VITALS — BP 138/80 | HR 85 | Temp 98.3°F | Wt 191.3 lb

## 2019-09-18 DIAGNOSIS — R42 Dizziness and giddiness: Secondary | ICD-10-CM | POA: Diagnosis not present

## 2019-09-18 LAB — CBC WITH DIFFERENTIAL/PLATELET
Absolute Monocytes: 605 cells/uL (ref 200–950)
Basophils Absolute: 126 cells/uL (ref 0–200)
Basophils Relative: 1 %
Eosinophils Absolute: 403 cells/uL (ref 15–500)
Eosinophils Relative: 3.2 %
HCT: 35.5 % (ref 35.0–45.0)
Hemoglobin: 11.9 g/dL (ref 11.7–15.5)
Lymphs Abs: 4208 cells/uL — ABNORMAL HIGH (ref 850–3900)
MCH: 29.5 pg (ref 27.0–33.0)
MCHC: 33.5 g/dL (ref 32.0–36.0)
MCV: 87.9 fL (ref 80.0–100.0)
MPV: 10.7 fL (ref 7.5–12.5)
Monocytes Relative: 4.8 %
Neutro Abs: 7258 cells/uL (ref 1500–7800)
Neutrophils Relative %: 57.6 %
Platelets: 360 10*3/uL (ref 140–400)
RBC: 4.04 10*6/uL (ref 3.80–5.10)
RDW: 12.1 % (ref 11.0–15.0)
Total Lymphocyte: 33.4 %
WBC: 12.6 10*3/uL — ABNORMAL HIGH (ref 3.8–10.8)

## 2019-09-18 NOTE — Progress Notes (Signed)
Established Patient Office Visit  Subjective:  Patient ID: Natalie Gilbert, female    DOB: 1975-04-17  Age: 44 y.o. MRN: [id number]  CC:  Chief Complaint  Patient presents with  . Dizziness    pt states she has been having dizziness for 5 to 6 days and had a headache 2 times last week     HPI Natalie Gilbert presents for dizziness.  This started last Thursday.  She described more vertigo type symptoms.  No syncope.  No significant lightheadedness.  No orthostatic symptoms.  She did have some associated mild nausea without vomiting.  No hearing changes.  No focal weakness.  No slurred speech.  No swallowing difficulties.  No ataxia.  Symptoms do seem to be somewhat worse with movement but she is not sure if she has noted any directional component.  She relates past history of one episode of vertigo several years ago.  No recent chest pains.  No major headaches.  She has fairly heavy menses intermittently and states she has had some mild anemia in the past.  She is requesting CBC.  Past Medical History:  Diagnosis Date  . Depression   . Eczema   . Heart murmur     No past surgical history on file.  Family History  Problem Relation Age of Onset  . Hypertension Mother   . Heart murmur Father   . Hypertension Brother   . Diabetes Maternal Grandmother   . Breast cancer Paternal Grandmother     Social History   Socioeconomic History  . Marital status: Single    Spouse name: Not on file  . Number of children: Not on file  . Years of education: Not on file  . Highest education level: Not on file  Occupational History  . Not on file  Tobacco Use  . Smoking status: Never Smoker  . Smokeless tobacco: Never Used  Vaping Use  . Vaping Use: Never used  Substance and Sexual Activity  . Alcohol use: No  . Drug use: No  . Sexual activity: Yes    Birth control/protection: Spermicide  Other Topics Concern  . Not on file  Social History Narrative  . Not on file   Social  Determinants of Health   Financial Resource Strain:   . Difficulty of Paying Living Expenses:   Food Insecurity:   . Worried About Charity fundraiser in the Last Year:   . Arboriculturist in the Last Year:   Transportation Needs:   . Film/video editor (Medical):   Marland Kitchen Lack of Transportation (Non-Medical):   Physical Activity:   . Days of Exercise per Week:   . Minutes of Exercise per Session:   Stress:   . Feeling of Stress :   Social Connections:   . Frequency of Communication with Friends and Family:   . Frequency of Social Gatherings with Friends and Family:   . Attends Religious Services:   . Active Member of Clubs or Organizations:   . Attends Archivist Meetings:   Marland Kitchen Marital Status:   Intimate Partner Violence:   . Fear of Current or Ex-Partner:   . Emotionally Abused:   Marland Kitchen Physically Abused:   . Sexually Abused:     Outpatient Medications Prior to Visit  Medication Sig Dispense Refill  . cetirizine (ZYRTEC) 10 MG tablet TAKE 1 TABLET BY MOUTH EVERY DAY 30 tablet 0  . desonide (DESOWEN) 0.05 % cream desonide 0.05 % topical cream  APPLY SPARINGLY  AND RUB GENTLY INTO THE AFFECTED AREA(S) BY TOPICAL ROUTE 2 TIMES PER DAY    . dupilumab (DUPIXENT) 300 MG/2ML prefilled syringe Inject 300 mg (1 syringe) under the skin every other week. 4 mL 1  . fluocinonide ointment (LIDEX) 0.05 % fluocinonide 0.05 % topical ointment  APPLY TO AFFECTED AREA OF HANDS TWICE A DAY FOR 14 DAYS    . MICROGESTIN FE 1/20 1-20 MG-MCG tablet     . triamcinolone ointment (KENALOG) 0.1 % Apply 1 application topically 2 (two) times daily.    . clobetasol cream (TEMOVATE) 6.83 % Apply 1 application topically 2 (two) times daily. Daily for up to 14 days. (Patient not taking: Reported on 06/04/2019) 45 g 1  . Clobetasol Prop Emollient Base (CLOBETASOL PROPIONATE E) 0.05 % emollient cream Apply topically BID (Patient not taking: Reported on 06/04/2019) 30 g 0  . clotrimazole-betamethasone  (LOTRISONE) cream APPLY 1 APPLICATION TOPICALLY 2 (TWO) TIMES DAILY. (Patient not taking: Reported on 06/04/2019) 45 g 0  . Crisaborole (EUCRISA) 2 % OINT Apply 1 application topically 2 (two) times a day. (Patient not taking: Reported on 06/04/2019) 100 g 3  . fluticasone (FLONASE) 50 MCG/ACT nasal spray Place 1 spray into both nostrils 2 (two) times daily. (Patient not taking: Reported on 06/04/2019) 16 g 3  . hydrOXYzine (ATARAX/VISTARIL) 25 MG tablet 1 tablet 1 hour before bedtime as needed for itching. (Patient not taking: Reported on 06/04/2019) 30 tablet 5  . Miconazole Nitrate 2 % AERP Apply 1 application topically daily. (Patient not taking: Reported on 06/04/2019) 130 g 0   No facility-administered medications prior to visit.    No Known Allergies  ROS Review of Systems  Constitutional: Negative for appetite change, chills and fever.  HENT: Negative for hearing loss.   Respiratory: Negative for shortness of breath.   Cardiovascular: Negative for chest pain.  Neurological: Positive for dizziness. Negative for seizures, syncope and headaches.      Objective:    Physical Exam Vitals reviewed.  Constitutional:      Appearance: Normal appearance.  HENT:     Right Ear: Tympanic membrane normal.     Left Ear: Tympanic membrane normal.  Eyes:     Extraocular Movements: Extraocular movements intact.     Comments: She does have some horizontal nystagmus to the right.  No vertical nystagmus  Cardiovascular:     Rate and Rhythm: Normal rate and regular rhythm.  Pulmonary:     Effort: Pulmonary effort is normal.     Breath sounds: Normal breath sounds.  Musculoskeletal:     Cervical back: Neck supple.  Neurological:     General: No focal deficit present.     Mental Status: She is alert.     Cranial Nerves: No cranial nerve deficit.     Motor: No weakness.     Coordination: Coordination normal.     Comments: She did have some reproducible vertigo relatively mild when turning head  to the right and lying supine quickly.  These were not reproduced on the left side     BP (!) 138/80 (BP Location: Left Arm, Cuff Size: Normal)   Pulse 85   Temp 98.3 F (36.8 C) (Oral)   Wt 191 lb 4.8 oz (86.8 kg)   SpO2 96%   BMI 32.58 kg/m  Wt Readings from Last 3 Encounters:  09/18/19 191 lb 4.8 oz (86.8 kg)  06/04/19 186 lb (84.4 kg)  10/01/18 183 lb 8 oz (83.2 kg)  Health Maintenance Due  Topic Date Due  . Hepatitis C Screening  Never done  . COVID-19 Vaccine (1) Never done    There are no preventive care reminders to display for this patient.  Lab Results  Component Value Date   TSH 0.83 05/15/2017   Lab Results  Component Value Date   WBC 12.8 (H) 11/24/2014   HGB 12.1 11/24/2014   HCT 37.0 11/24/2014   MCV 87.0 11/24/2014   PLT 429.0 (H) 11/24/2014   Lab Results  Component Value Date   NA 135 06/04/2019   K 4.6 06/04/2019   CO2 25 06/04/2019   GLUCOSE 91 06/04/2019   BUN 12 06/04/2019   CREATININE 0.83 06/04/2019   BILITOT 0.3 10/28/2014   ALKPHOS 55 10/28/2014   AST 23 10/28/2014   ALT 19 10/28/2014   PROT 7.5 10/28/2014   ALBUMIN 4.3 10/28/2014   CALCIUM 9.5 06/04/2019   GFR 90.93 06/04/2019   Lab Results  Component Value Date   CHOL 211 (H) 06/04/2019   Lab Results  Component Value Date   HDL 48.00 06/04/2019   Lab Results  Component Value Date   LDLCALC 126 (H) 06/04/2019   Lab Results  Component Value Date   TRIG 187.0 (H) 06/04/2019   Lab Results  Component Value Date   CHOLHDL 4 06/04/2019   Lab Results  Component Value Date   HGBA1C 5.4 06/04/2019      Assessment & Plan:   Patient presents with some dizziness this past week.  She is describing vertigo and this seems to be triggered with head to the right.  She has some mild horizontal nystagmus.  Otherwise nonfocal neuro exam.  Suspect benign peripheral positional vertigo  -We recommended trial Epley maneuvers with handout given and we reviewed instructions.   Recommend trying nasal least 2-3 times daily and until symptoms resolved  -Follow-up for any persistent symptoms or immediately for any new symptoms such as slurred speech, focal weakness, hearing loss, or other concerns  -pt requesting CBC and this was drawn  30 minutes were spent reviewing symptoms, examining, discussing assessment and proposed treatment.   No orders of the defined types were placed in this encounter.   Follow-up: No follow-ups on file.    Carolann Littler, MD

## 2019-09-18 NOTE — Patient Instructions (Signed)
Benign Positional Vertigo Vertigo is the feeling that you or your surroundings are moving when they are not. Benign positional vertigo is the most common form of vertigo. This is usually a harmless condition (benign). This condition is positional. This means that symptoms are triggered by certain movements and positions. This condition can be dangerous if it occurs while you are doing something that could cause harm to you or others. This includes activities such as driving or operating machinery. What are the causes? In many cases, the cause of this condition is not known. It may be caused by a disturbance in an area of the inner ear that helps your brain to sense movement and balance. This disturbance can be caused by:  Viral infection (labyrinthitis).  Head injury.  Repetitive motion, such as jumping, dancing, or running. What increases the risk? You are more likely to develop this condition if:  You are a woman.  You are 50 years of age or older. What are the signs or symptoms? Symptoms of this condition usually happen when you move your head or your eyes in different directions. Symptoms may start suddenly, and usually last for less than a minute. They include:  Loss of balance and falling.  Feeling like you are spinning or moving.  Feeling like your surroundings are spinning or moving.  Nausea and vomiting.  Blurred vision.  Dizziness.  Involuntary eye movement (nystagmus). Symptoms can be mild and cause only minor problems, or they can be severe and interfere with daily life. Episodes of benign positional vertigo may return (recur) over time. Symptoms may improve over time. How is this diagnosed? This condition may be diagnosed based on:  Your medical history.  Physical exam of the head, neck, and ears.  Tests, such as: ? MRI. ? CT scan. ? Eye movement tests. Your health care provider may ask you to change positions quickly while he or she watches you for symptoms  of benign positional vertigo, such as nystagmus. Eye movement may be tested with a variety of exams that are designed to evaluate or stimulate vertigo. ? An electroencephalogram (EEG). This records electrical activity in your brain. ? Hearing tests. You may be referred to a health care provider who specializes in ear, nose, and throat (ENT) problems (otolaryngologist) or a provider who specializes in disorders of the nervous system (neurologist). How is this treated?  This condition may be treated in a session in which your health care provider moves your head in specific positions to adjust your inner ear back to normal. Treatment for this condition may take several sessions. Surgery may be needed in severe cases, but this is rare. In some cases, benign positional vertigo may resolve on its own in 2-4 weeks. Follow these instructions at home: Safety  Move slowly. Avoid sudden body or head movements or certain positions, as told by your health care provider.  Avoid driving until your health care provider says it is safe for you to do so.  Avoid operating heavy machinery until your health care provider says it is safe for you to do so.  Avoid doing any tasks that would be dangerous to you or others if vertigo occurs.  If you have trouble walking or keeping your balance, try using a cane for stability. If you feel dizzy or unstable, sit down right away.  Return to your normal activities as told by your health care provider. Ask your health care provider what activities are safe for you. General instructions  Take over-the-counter   and prescription medicines only as told by your health care provider.  Drink enough fluid to keep your urine pale yellow.  Keep all follow-up visits as told by your health care provider. This is important. Contact a health care provider if:  You have a fever.  Your condition gets worse or you develop new symptoms.  Your family or friends notice any  behavioral changes.  You have nausea or vomiting that gets worse.  You have numbness or a "pins and needles" sensation. Get help right away if you:  Have difficulty speaking or moving.  Are always dizzy.  Faint.  Develop severe headaches.  Have weakness in your legs or arms.  Have changes in your hearing or vision.  Develop a stiff neck.  Develop sensitivity to light. Summary  Vertigo is the feeling that you or your surroundings are moving when they are not. Benign positional vertigo is the most common form of vertigo.  The cause of this condition is not known. It may be caused by a disturbance in an area of the inner ear that helps your brain to sense movement and balance.  Symptoms include loss of balance and falling, feeling that you or your surroundings are moving, nausea and vomiting, and blurred vision.  This condition can be diagnosed based on symptoms, physical exam, and other tests, such as MRI, CT scan, eye movement tests, and hearing tests.  Follow safety instructions as told by your health care provider. You will also be told when to contact your health care provider in case of problems. This information is not intended to replace advice given to you by your health care provider. Make sure you discuss any questions you have with your health care provider. Document Revised: 07/19/2017 Document Reviewed: 07/19/2017 Elsevier Patient Education  2020 [employer]  

## 2019-10-01 ENCOUNTER — Other Ambulatory Visit: Payer: Self-pay | Admitting: Pharmacist

## 2019-10-01 MED ORDER — DUPIXENT 300 MG/2ML ~~LOC~~ SOSY: PREFILLED_SYRINGE | SUBCUTANEOUS | 1 refills | Status: DC

## 2019-10-07 MED FILL — DUPIXENT 300 MG/2 ML SAFE S: 300 | 28 days supply | Qty: 4 | Fill #0

## 2019-11-07 MED FILL — DUPIXENT 300 MG/2 ML SAFE S: 300 | 28 days supply | Qty: 4 | Fill #1

## 2019-12-02 ENCOUNTER — Other Ambulatory Visit: Payer: Self-pay | Admitting: Pharmacist

## 2019-12-02 MED ORDER — DUPIXENT 300 MG/2ML ~~LOC~~ SOSY: PREFILLED_SYRINGE | SUBCUTANEOUS | 1 refills | Status: DC

## 2019-12-02 MED FILL — DUPIXENT 300 MG/2 ML SAFE S: 300 | 28 days supply | Qty: 4 | Fill #0

## 2020-01-02 MED FILL — DUPIXENT 300 MG/2 ML SAFE S: 300 | 28 days supply | Qty: 4 | Fill #1

## 2020-02-04 ENCOUNTER — Other Ambulatory Visit: Payer: Self-pay | Admitting: Pharmacist

## 2020-02-04 MED ORDER — DUPIXENT 300 MG/2ML ~~LOC~~ SOSY: PREFILLED_SYRINGE | SUBCUTANEOUS | 1 refills | Status: DC

## 2020-02-04 MED FILL — DUPIXENT 300 MG/2 ML SAFE S: 300 | 28 days supply | Qty: 4 | Fill #0

## 2020-02-10 DIAGNOSIS — L209 Atopic dermatitis, unspecified: Secondary | ICD-10-CM | POA: Diagnosis not present

## 2020-02-11 MED FILL — CLOBETASOL PROPIONATE 0.05: 0.05 | 14 days supply | Qty: 60 | Fill #0

## 2020-02-26 ENCOUNTER — Other Ambulatory Visit: Payer: 59

## 2020-02-26 DIAGNOSIS — Z20822 Contact with and (suspected) exposure to covid-19: Secondary | ICD-10-CM

## 2020-02-27 LAB — NOVEL CORONAVIRUS, NAA: SARS-CoV-2, NAA: NOT DETECTED

## 2020-02-27 LAB — SARS-COV-2, NAA 2 DAY TAT

## 2020-02-28 ENCOUNTER — Ambulatory Visit: Payer: 59 | Attending: Internal Medicine

## 2020-02-28 DIAGNOSIS — Z23 Encounter for immunization: Secondary | ICD-10-CM

## 2020-02-28 NOTE — Progress Notes (Signed)
   Covid-[facility] Vaccination [facility]  Name:  Natalie Gilbert    MRN: [id number] DOB: 07/12/1975  02/28/2020  Natalie Gilbert was observed post Covid-19 immunization for 15 minutes without incident. She was provided with Vaccine Information Sheet and instruction to access the V-Safe system.   Natalie Gilbert was instructed to call 911 with any severe reactions post vaccine: Marland Kitchen Difficulty breathing  . Swelling of face and throat  . A fast heartbeat  . A bad rash all over body  . Dizziness and weakness   Immunizations Administered    Name Date Dose VIS Date Route   Pfizer COVID-19 Vaccine 02/28/2020  2:51 PM 0.3 mL 12/11/2019 Intramuscular   Manufacturer: [employer]   Lot: [id number]   [place]: [id number]

## 2020-03-02 MED FILL — DUPIXENT 300 MG/2 ML SAFE S: 300 | 28 days supply | Qty: 4 | Fill #1

## 2020-03-09 ENCOUNTER — Ambulatory Visit: Payer: 59 | Admitting: Family Medicine

## 2020-03-13 ENCOUNTER — Other Ambulatory Visit: Payer: Self-pay

## 2020-03-16 ENCOUNTER — Other Ambulatory Visit: Payer: Self-pay

## 2020-03-16 ENCOUNTER — Encounter: Payer: Self-pay | Admitting: Family Medicine

## 2020-03-16 ENCOUNTER — Ambulatory Visit: Payer: 59 | Admitting: Family Medicine

## 2020-03-16 VITALS — BP 122/80 | HR 82 | Temp 98.9°F | Resp 16 | Ht 64.25 in | Wt 184.2 lb

## 2020-03-16 DIAGNOSIS — G4700 Insomnia, unspecified: Secondary | ICD-10-CM | POA: Diagnosis not present

## 2020-03-16 DIAGNOSIS — F320 Major depressive disorder, single episode, mild: Secondary | ICD-10-CM | POA: Diagnosis not present

## 2020-03-16 DIAGNOSIS — F411 Generalized anxiety disorder: Secondary | ICD-10-CM | POA: Diagnosis not present

## 2020-03-16 DIAGNOSIS — F4010 Social phobia, unspecified: Secondary | ICD-10-CM

## 2020-03-16 MED ORDER — ESCITALOPRAM OXALATE 10 MG PO TABS: 10.0000 mg | ORAL_TABLET | Freq: Every day | ORAL | 1 refills | Status: DC

## 2020-03-16 MED ORDER — PROPRANOLOL HCL 20 MG PO TABS
20.0000 mg | ORAL_TABLET | Freq: Two times a day (BID) | ORAL | 1 refills | Status: DC | PRN
Start: 2020-03-16 — End: 2021-03-30

## 2020-03-16 NOTE — Assessment & Plan Note (Signed)
CBT strongly recommended. Propranolol 20 mg bid prn. Side effects of medication discussed.

## 2020-03-16 NOTE — Assessment & Plan Note (Signed)
Can be a contributing factor to some of her symptoms like concentration. Lexapro started today.

## 2020-03-16 NOTE — Patient Instructions (Addendum)
A few things to remember from today's visit:  GAD (generalized anxiety disorder) - Plan: escitalopram (LEXAPRO) 10 MG tablet  Social anxiety disorder - Plan: propranolol (INDERAL) 20 MG tablet  Insomnia, unspecified type  If you need refills please call your pharmacy. Do not use My Chart to request refills or for acute issues that need immediate attention.   Today we started Lexapro, this type of medications can increase suicidal risk. This is more prevalent among children,adolecents, and young adults with major depression or other psychiatric disorders. It can also make depression worse. Most common side effects are gastrointestinal, self limited after a few weeks: diarrhea, nausea, constipation  Or diarrhea among some.  In general it is well tolerated. We will follow closely.  Take Propranolol 20 mg 15 min before public event or testing.  Please arrange appt with psychotherapist.  Please be sure medication list is accurate. If a new problem present, please set up appointment sooner than planned today.

## 2020-03-16 NOTE — Assessment & Plan Note (Signed)
We discussed treatment options. She agrees with trying Lexapro 10 mg daily, some side effects discussed. Instructed about warning signs. Recommend CBT, information about providers given.

## 2020-03-16 NOTE — Progress Notes (Signed)
Chief Complaint  Patient presents with  . Acute Visit    C/o having some anxiety/focusing issues x 1 year off/on.  Things have been affecting her school learning.    HPI: [name] is a 45 y.o. female, who is here today with above complaint. Anxiety getting worse for the past year and  She was last seen on 06/04/19 for her CPE. She has hx of depression. She was on Bupropion before.  Problem is exacerbated by stress at work and a new class she started, public speaking (10/9831).  She feels her heart starts pounding, hyperventilating,face flushes,feeling hot, and jittery. Fear to be in front of people, She also feels anxious before tests, she has several bowel movements. No associated abdominal pain,nausea,or vomiting.  She wonders if she has ADHD because when taking tests at school, she does not have time to answer all questions. She usually starts from last questions and answers those she knows better. She think she needs accommodations for taking tests.  When she is preparing tests,she gets anxious just thinking about not getting the right answer to questions. She is studying with a classmate.  +Decreased appetite.  Insomnia: She is waking up in the middle of the night for the past 6 months, between 2-5 am. She has not tried OTC medications.  Sisters with hx of anxiety. No FHx of bipolar disorder.  Depression screen [facility] 2/9 03/16/2020 06/04/2019 05/15/2017 08/09/2013  Decreased Interest 1 0 0 0  Down, Depressed, Hopeless 1 0 0 0  PHQ - 2 Score 2 0 0 0  Altered sleeping 1 0 - -  Tired, decreased energy 1 0 - -  Change in appetite 1 0 - -  Feeling bad or failure about yourself  1 0 - -  Trouble concentrating 1 0 - -  Moving slowly or fidgety/restless 0 0 - -  Suicidal thoughts 0 0 - -  PHQ-9 Score 7 0 - -  Difficult doing work/chores Somewhat difficult Not difficult at all - -   GAD 7 : Generalized Anxiety Score 03/16/2020  Nervous, Anxious, on Edge 1   Control/stop worrying 1  Worry too much - different things 2  Trouble relaxing 2  Restless 1  Easily annoyed or irritable 2  Afraid - awful might happen 0  Total GAD 7 Score 9  Anxiety Difficulty Somewhat difficult   Review of Systems  Constitutional: Positive for appetite change and fatigue.  Respiratory: Negative for shortness of breath and wheezing.   Cardiovascular: Negative for chest pain.  Endocrine: Negative for cold intolerance and heat intolerance.  Musculoskeletal: Negative for gait problem and myalgias.  Skin: Negative for rash.  Neurological: Negative for weakness and headaches.  Psychiatric/Behavioral: Positive for sleep disturbance. Negative for confusion, hallucinations and suicidal ideas. The patient is nervous/anxious.   Rest see pertinent positives and negatives per HPI.  Current Outpatient Medications on File Prior to Visit  Medication Sig Dispense Refill  . cetirizine (ZYRTEC) 10 MG tablet TAKE 1 TABLET BY MOUTH EVERY DAY 30 tablet 0  . desonide (DESOWEN) 0.05 % cream desonide 0.05 % topical cream  APPLY SPARINGLY AND RUB GENTLY INTO THE AFFECTED AREA(S) BY TOPICAL ROUTE 2 TIMES PER DAY    . dupilumab (DUPIXENT) 300 MG/2ML prefilled syringe Use 1 injection every other week under the skin. 4 mL 1  . fluocinonide ointment (LIDEX) 0.05 % fluocinonide 0.05 % topical ointment  APPLY TO AFFECTED AREA OF HANDS TWICE A DAY FOR 14 DAYS    .  MICROGESTIN FE 1/20 1-20 MG-MCG tablet     . clobetasol cream (TEMOVATE) [id number] % Apply 1 application topically 2 (two) times daily. Daily for up to 14 days. (Patient not taking: No sig reported) 45 g 1  . Clobetasol Prop Emollient Base (CLOBETASOL PROPIONATE E) 0.05 % emollient cream Apply topically BID (Patient not taking: No sig reported) 30 g 0  . clotrimazole-betamethasone (LOTRISONE) cream APPLY 1 APPLICATION TOPICALLY 2 (TWO) TIMES DAILY. (Patient not taking: No sig reported) 45 g 0  . Crisaborole (EUCRISA) 2 % OINT Apply 1  application topically 2 (two) times a day. (Patient not taking: No sig reported) 100 g 3  . fluticasone (FLONASE) 50 MCG/ACT nasal spray Place 1 spray into both nostrils 2 (two) times daily. (Patient not taking: No sig reported) 16 g 3  . hydrOXYzine (ATARAX/VISTARIL) 25 MG tablet 1 tablet 1 hour before bedtime as needed for itching. (Patient not taking: No sig reported) 30 tablet 5  . Miconazole Nitrate 2 % AERP Apply 1 application topically daily. (Patient not taking: No sig reported) 130 g 0  . triamcinolone ointment (KENALOG) 0.1 % Apply 1 application topically 2 (two) times daily. (Patient not taking: Reported on 03/16/2020)     No current facility-administered medications on file prior to visit.   Past Medical History:  Diagnosis Date  . Depression   . Eczema   . Heart murmur    No Known Allergies  Social History   Socioeconomic History  . Marital status: Single    Spouse name: Not on file  . Number of children: Not on file  . Years of education: Not on file  . Highest education level: Not on file  Occupational History  . Not on file  Tobacco Use  . Smoking status: Never Smoker  . Smokeless tobacco: Never Used  Vaping Use  . Vaping Use: Never used  Substance and Sexual Activity  . Alcohol use: No  . Drug use: No  . Sexual activity: Yes    Birth control/protection: Spermicide  Other Topics Concern  . Not on file  Social History Narrative  . Not on file   Social Determinants of Health   Financial Resource Strain: Not on file  Food Insecurity: Not on file  Transportation Needs: Not on file  Physical Activity: Not on file  Stress: Not on file  Social Connections: Not on file   Vitals:   03/16/20 0903  BP: 122/80  Pulse: 82  Resp: 16  Temp: 98.9 F (37.2 C)  SpO2: 97%   Body mass index is 31.37 kg/m.  Physical Exam Vitals and nursing note reviewed.  Constitutional:      General: She is not in acute distress.    Appearance: She is well-developed.   HENT:     Head: Normocephalic and atraumatic.     Mouth/Throat:     Mouth: Oropharynx is clear and moist and mucous membranes are normal. Mucous membranes are moist.     Pharynx: Oropharynx is clear.  Eyes:     Conjunctiva/sclera: Conjunctivae normal.  Cardiovascular:     Rate and Rhythm: Normal rate and regular rhythm.     Pulses:          Dorsalis pedis pulses are 2+ on the right side and 2+ on the left side.     Heart sounds: No murmur heard.   Pulmonary:     Effort: Pulmonary effort is normal. No respiratory distress.     Breath sounds: Normal breath sounds.  Abdominal:     Palpations: Abdomen is soft. There is no hepatomegaly or mass.     Tenderness: There is no abdominal tenderness.  Musculoskeletal:        General: No edema.  Lymphadenopathy:     Cervical: No cervical adenopathy.  Skin:    General: Skin is warm.     Findings: No erythema or rash.  Neurological:     Mental Status: She is alert and oriented to person, place, and time.     Cranial Nerves: No cranial nerve deficit.     Gait: Gait normal.     Deep Tendon Reflexes: Strength normal.  Psychiatric:        Mood and Affect: Mood is anxious. Affect is labile.     ASSESSMENT AND PLAN: Ms.Talley was seen today for worsening anxiety.  Diagnoses and all orders for this visit:  Insomnia, unspecified type Problem could be aggravates by anxiety, therefore SSRI may help. Good sleep hygiene.  Depression, major, single episode, mild ([place]) Can be a contributing factor to some of her symptoms like concentration. Lexapro started today.  GAD (generalized anxiety disorder) We discussed treatment options. She agrees with trying Lexapro 10 mg daily, some side effects discussed. Instructed about warning signs. Recommend CBT, information about providers given.  Social anxiety disorder CBT strongly recommended. Propranolol 20 mg bid prn. Side effects of medication discussed.   Return in about 6 weeks (around  04/27/2020) for anxiety,insomnia.   Gerald Honea G. [place], MD  [facility]. [place] office.   A few things to remember from today's visit:  GAD (generalized anxiety disorder) - Plan: escitalopram (LEXAPRO) 10 MG tablet  Social anxiety disorder - Plan: propranolol (INDERAL) 20 MG tablet  Insomnia, unspecified type  If you need refills please call your pharmacy. Do not use My Chart to request refills or for acute issues that need immediate attention.   Today we started Lexapro, this type of medications can increase suicidal risk. This is more prevalent among children,adolecents, and young adults with major depression or other psychiatric disorders. It can also make depression worse. Most common side effects are gastrointestinal, self limited after a few weeks: diarrhea, nausea, constipation  Or diarrhea among some.  In general it is well tolerated. We will follow closely.  Take Propranolol 20 mg 15 min before public event or testing.  Please arrange appt with psychotherapist.  Please be sure medication list is accurate. If a new problem present, please set up appointment sooner than planned today.

## 2020-03-23 ENCOUNTER — Other Ambulatory Visit: Payer: Self-pay | Admitting: Pharmacist

## 2020-03-23 MED ORDER — DUPIXENT 300 MG/2ML ~~LOC~~ SOSY: PREFILLED_SYRINGE | SUBCUTANEOUS | 5 refills | Status: DC

## 2020-04-02 ENCOUNTER — Other Ambulatory Visit (HOSPITAL_COMMUNITY): Payer: Self-pay | Admitting: Dermatology

## 2020-04-09 MED FILL — DUPIXENT 300 MG/2 ML SAFE S: 300 | 28 days supply | Qty: 4 | Fill #0

## 2020-04-27 ENCOUNTER — Ambulatory Visit: Payer: 59 | Admitting: Family Medicine

## 2020-05-11 MED FILL — DUPIXENT 300 MG/2 ML SAFE S: 300 | 28 days supply | Qty: 4 | Fill #1

## 2020-05-15 ENCOUNTER — Other Ambulatory Visit (HOSPITAL_COMMUNITY): Payer: Self-pay

## 2020-06-01 DIAGNOSIS — Z1231 Encounter for screening mammogram for malignant neoplasm of breast: Secondary | ICD-10-CM | POA: Diagnosis not present

## 2020-06-01 DIAGNOSIS — Z3041 Encounter for surveillance of contraceptive pills: Secondary | ICD-10-CM | POA: Diagnosis not present

## 2020-06-01 DIAGNOSIS — Z6831 Body mass index (BMI) 31.0-31.9, adult: Secondary | ICD-10-CM | POA: Diagnosis not present

## 2020-06-01 DIAGNOSIS — Z01419 Encounter for gynecological examination (general) (routine) without abnormal findings: Secondary | ICD-10-CM | POA: Diagnosis not present

## 2020-06-01 DIAGNOSIS — Z202 Contact with and (suspected) exposure to infections with a predominantly sexual mode of transmission: Secondary | ICD-10-CM | POA: Diagnosis not present

## 2020-06-01 DIAGNOSIS — Z1389 Encounter for screening for other disorder: Secondary | ICD-10-CM | POA: Diagnosis not present

## 2020-06-01 DIAGNOSIS — Z130 Encounter for screening for diseases of the blood and blood-forming organs and certain disorders involving the immune mechanism: Secondary | ICD-10-CM | POA: Diagnosis not present

## 2020-06-04 ENCOUNTER — Other Ambulatory Visit (HOSPITAL_COMMUNITY): Payer: Self-pay

## 2020-06-04 MED FILL — Dupilumab Subcutaneous Soln Prefilled Syringe 300 MG/2ML: SUBCUTANEOUS | 28 days supply | Qty: 4 | Fill #0 | Status: AC

## 2020-06-08 ENCOUNTER — Other Ambulatory Visit (HOSPITAL_COMMUNITY): Payer: Self-pay

## 2020-06-10 ENCOUNTER — Other Ambulatory Visit (HOSPITAL_COMMUNITY): Payer: Self-pay

## 2020-07-02 ENCOUNTER — Other Ambulatory Visit (HOSPITAL_COMMUNITY): Payer: Self-pay

## 2020-07-02 MED FILL — Dupilumab Subcutaneous Soln Prefilled Syringe 300 MG/2ML: SUBCUTANEOUS | 28 days supply | Qty: 4 | Fill #1 | Status: AC

## 2020-07-06 DIAGNOSIS — M79672 Pain in left foot: Secondary | ICD-10-CM | POA: Diagnosis not present

## 2020-07-06 DIAGNOSIS — M71571 Other bursitis, not elsewhere classified, right ankle and foot: Secondary | ICD-10-CM | POA: Diagnosis not present

## 2020-07-06 DIAGNOSIS — M71572 Other bursitis, not elsewhere classified, left ankle and foot: Secondary | ICD-10-CM | POA: Diagnosis not present

## 2020-07-06 DIAGNOSIS — M79671 Pain in right foot: Secondary | ICD-10-CM | POA: Diagnosis not present

## 2020-07-06 DIAGNOSIS — M722 Plantar fascial fibromatosis: Secondary | ICD-10-CM | POA: Diagnosis not present

## 2020-07-08 ENCOUNTER — Other Ambulatory Visit (HOSPITAL_COMMUNITY): Payer: Self-pay

## 2020-07-09 ENCOUNTER — Other Ambulatory Visit (HOSPITAL_COMMUNITY): Payer: Self-pay

## 2020-07-21 DIAGNOSIS — M71572 Other bursitis, not elsewhere classified, left ankle and foot: Secondary | ICD-10-CM | POA: Diagnosis not present

## 2020-07-21 DIAGNOSIS — M71571 Other bursitis, not elsewhere classified, right ankle and foot: Secondary | ICD-10-CM | POA: Diagnosis not present

## 2020-07-21 DIAGNOSIS — M722 Plantar fascial fibromatosis: Secondary | ICD-10-CM | POA: Diagnosis not present

## 2020-07-21 DIAGNOSIS — M2041 Other hammer toe(s) (acquired), right foot: Secondary | ICD-10-CM | POA: Diagnosis not present

## 2020-07-21 DIAGNOSIS — M2042 Other hammer toe(s) (acquired), left foot: Secondary | ICD-10-CM | POA: Diagnosis not present

## 2020-07-29 ENCOUNTER — Other Ambulatory Visit (HOSPITAL_COMMUNITY): Payer: Self-pay

## 2020-07-29 MED FILL — Dupilumab Subcutaneous Soln Prefilled Syringe 300 MG/2ML: SUBCUTANEOUS | 28 days supply | Qty: 4 | Fill #2 | Status: AC

## 2020-08-06 ENCOUNTER — Other Ambulatory Visit (HOSPITAL_COMMUNITY): Payer: Self-pay

## 2020-08-07 ENCOUNTER — Other Ambulatory Visit (HOSPITAL_COMMUNITY): Payer: Self-pay

## 2020-08-10 ENCOUNTER — Other Ambulatory Visit: Payer: Self-pay

## 2020-08-10 ENCOUNTER — Encounter: Payer: Self-pay | Admitting: Family Medicine

## 2020-08-10 ENCOUNTER — Ambulatory Visit (INDEPENDENT_AMBULATORY_CARE_PROVIDER_SITE_OTHER): Payer: 59 | Admitting: Family Medicine

## 2020-08-10 VITALS — BP 140/90 | HR 84 | Temp 98.4°F | Resp 16 | Ht 64.5 in | Wt 193.4 lb

## 2020-08-10 DIAGNOSIS — Z130 Encounter for screening for diseases of the blood and blood-forming organs and certain disorders involving the immune mechanism: Secondary | ICD-10-CM | POA: Diagnosis not present

## 2020-08-10 DIAGNOSIS — Z1159 Encounter for screening for other viral diseases: Secondary | ICD-10-CM

## 2020-08-10 DIAGNOSIS — R011 Cardiac murmur, unspecified: Secondary | ICD-10-CM | POA: Diagnosis not present

## 2020-08-10 DIAGNOSIS — R030 Elevated blood-pressure reading, without diagnosis of hypertension: Secondary | ICD-10-CM | POA: Diagnosis not present

## 2020-08-10 DIAGNOSIS — Z0000 Encounter for general adult medical examination without abnormal findings: Secondary | ICD-10-CM

## 2020-08-10 DIAGNOSIS — E785 Hyperlipidemia, unspecified: Secondary | ICD-10-CM | POA: Diagnosis not present

## 2020-08-10 DIAGNOSIS — Z1329 Encounter for screening for other suspected endocrine disorder: Secondary | ICD-10-CM

## 2020-08-10 DIAGNOSIS — Z13228 Encounter for screening for other metabolic disorders: Secondary | ICD-10-CM

## 2020-08-10 DIAGNOSIS — R0602 Shortness of breath: Secondary | ICD-10-CM

## 2020-08-10 NOTE — Patient Instructions (Addendum)
Today you have you routine preventive visit. A few things to remember from today's visit:  Routine general medical examination at a health care facility  SOB (shortness of breath) - Plan: EKG 12-Lead, CBC, TSH  Screening for endocrine, metabolic and immunity disorder - Plan: Basic metabolic panel, Hemoglobin A1c  Hyperlipidemia, unspecified hyperlipidemia type - Plan: Lipid panel  Elevated blood pressure reading  Encounter for HCV screening test for low risk patient - Plan: Hepatitis C antibody  Do not use My Chart to request refills or for acute issues that need immediate attention.   I do not think your shortness of breath is concerning at this time, could be related with weight gain.  I am thinking weight loss will help. Monitor blood pressure at home, today was a little bit elevated.  Please be sure medication list is accurate. If a new problem present, please set up appointment sooner than planned today.  At least 150 minutes of moderate exercise per week, daily brisk walking for 15-30 min is a good exercise option. Healthy diet low in saturated (animal) fats and sweets and consisting of fresh fruits and vegetables, lean meats such as fish and white chicken and whole grains.  These are some of recommendations for screening depending of age and risk factors:  - Vaccines:  Tdap vaccine every 10 years.  Shingles vaccine recommended at age 76, could be given after 44 years of age but not sure about insurance coverage.   Pneumonia vaccines: Pneumovax at 32. Sometimes Pneumovax is giving earlier if history of smoking, lung disease,diabetes,kidney disease among some.  Screening for diabetes at age 28 and every 3 years.  Cervical cancer prevention:  Pap smear starts at 44 years of age and continues periodically until 44 years old in low risk women. Pap smear every 3 years between 29 and 16 years old. Pap smear every 3-5 years between women 23 and older if pap smear negative and  HPV screening negative.   -Breast cancer: Mammogram: There is disagreement between experts about when to start screening in low risk asymptomatic female but recent recommendations are to start screening at 78 and not later than 44 years old , every 1-2 years and after 44 yo q 2 years. Screening is recommended until 44 years old but some women can continue screening depending of healthy issues.  Colon cancer screening: Has been recently changed to 44 yo. Insurance may not cover until you are 44 years old. Screening is recommended until 44 years old.  Cholesterol disorder screening at age 45 and every 3 years. N/A  Also recommended:  Dental visit- Brush and floss your teeth twice daily; visit your dentist twice a year. Eye doctor- Get an eye exam at least every 2 years. Helmet use- Always wear a helmet when riding a bicycle, motorcycle, rollerblading or skateboarding. Safe sex- If you may be exposed to sexually transmitted infections, use a condom. Seat belts- Seat belts can save your live; always wear one. Smoke/Carbon Monoxide detectors- These detectors need to be installed on the appropriate level of your home. Replace batteries at least once a year. Skin cancer- When out in the sun please cover up and use sunscreen 15 SPF or higher. Violence- If anyone is threatening or hurting you, please tell your healthcare provider.  Drink alcohol in moderation- Limit alcohol intake to one drink or less per day. Never drink and drive. Calcium supplementation 1000 to 1200 mg daily, ideally through your diet.  Vitamin D supplementation 800 units daily.

## 2020-08-10 NOTE — Progress Notes (Signed)
HPI: [name] is a 45 y.o. female, who is here today for her routine physical.  Last CPE: 06/04/19  Regular exercise 3 or more time per week: She is not exercising. Following a healthful diet: She has not been consistent. Finishing school, going through some stress, "eating too much." She lives alone.  Chronic medical problems: Depression,GAD,eczema, and HLD among some. Following with podiatrist for left foot pain.  Pap smear: 05/2019. Follows with gyn regularly, Dr Natalie Gilbert. Last gyn visit on 06/01/20.  Immunization History  Administered Date(s) Administered   Influenza,inj,Quad PF,6+ Mos 10/28/2016   Influenza,inj,quad, With Preservative 10/28/2016   MMR 06/04/2019   PFIZER(Purple Top)SARS-COV-2 Vaccination 03/01/2019, 03/22/2019, 02/28/2020   PPD Test 06/28/2019   Tdap 10/19/2012, 06/30/2016   Mammogram: 04/2017. Colonoscopy: N/A DEXA: N/A Hep C screening: Never  Concerns today: A couple of months of SOB with certain activities but not all the time. No associated CP,palpitations,or diaphoresis. She has gained some wait during this time.  BP mildly elevated today. No hx of HTN. Social anxiety: She takes Propranolol 20 mg bid prn.  HLD: She is not on pharmacologic treatment.  Component     Latest Ref Rng & Units 06/04/2019  Cholesterol     0 - 200 mg/dL 211 (H)  Triglycerides     0.0 - 149.0 mg/dL 187.0 (H)  HDL Cholesterol     >39.00 mg/dL 48.00  VLDL     0.0 - 40.0 mg/dL 37.4  LDL (calc)     0 - 99 mg/dL 126 (H)  Total CHOL/HDL Ratio      4  NonHDL      163.31   Review of Systems  Constitutional:  Negative for appetite change, fatigue and fever.  HENT:  Negative for dental problem, hearing loss, mouth sores, sore throat, trouble swallowing and voice change.   Eyes:  Negative for redness and visual disturbance.  Respiratory:  Negative for cough and wheezing.   Cardiovascular:  Negative for leg swelling.  Gastrointestinal:  Negative for  abdominal pain, nausea and vomiting.       No changes in bowel habits.  Endocrine: Negative for cold intolerance, heat intolerance, polydipsia, polyphagia and polyuria.  Genitourinary:  Negative for decreased urine volume, dysuria, hematuria, vaginal bleeding and vaginal discharge.  Musculoskeletal:  Negative for arthralgias, gait problem and myalgias.  Skin:  Negative for color change and rash.  Allergic/Immunologic: Positive for environmental allergies.  Neurological:  Negative for syncope, weakness and headaches.  Hematological:  Negative for adenopathy. Does not bruise/bleed easily.  Psychiatric/Behavioral:  Negative for confusion and sleep disturbance. The patient is not nervous/anxious.   All other systems reviewed and are negative.  Current Outpatient Medications on File Prior to Visit  Medication Sig Dispense Refill   cetirizine (ZYRTEC) 10 MG tablet TAKE 1 TABLET BY MOUTH EVERY DAY 30 tablet 0   desonide (DESOWEN) 0.05 % cream desonide 0.05 % topical cream  APPLY SPARINGLY AND RUB GENTLY INTO THE AFFECTED AREA(S) BY TOPICAL ROUTE 2 TIMES PER DAY     dupilumab (DUPIXENT) 300 MG/2ML prefilled syringe USE 1 INJECTION EVERY OTHER WEEK UNDER THE SKIN. 4 mL 5   fluocinonide ointment (LIDEX) 0.05 % fluocinonide 0.05 % topical ointment  APPLY TO AFFECTED AREA OF HANDS TWICE A DAY FOR 14 DAYS     MICROGESTIN FE 1/20 1-20 MG-MCG tablet      propranolol (INDERAL) 20 MG tablet Take 1 tablet (20 mg total) by mouth 2 (two) times daily as needed.  30 tablet 1   No current facility-administered medications on file prior to visit.   Past Medical History:  Diagnosis Date   Depression    Eczema    Heart murmur    History reviewed. No pertinent surgical history.  No Known Allergies  Family History  Problem Relation Age of Onset   Hypertension Mother    Heart murmur Father    Hypertension Brother    Diabetes Maternal Grandmother    Breast cancer Paternal Grandmother    Social History    Socioeconomic History   Marital status: Single    Spouse name: Not on file   Number of children: Not on file   Years of education: Not on file   Highest education level: Not on file  Occupational History   Not on file  Tobacco Use   Smoking status: Never   Smokeless tobacco: Never  Vaping Use   Vaping Use: Never used  Substance and Sexual Activity   Alcohol use: No   Drug use: No   Sexual activity: Yes    Birth control/protection: Spermicide  Other Topics Concern   Not on file  Social History Narrative   Not on file   Social Determinants of Health   Financial Resource Strain: Not on file  Food Insecurity: Not on file  Transportation Needs: Not on file  Physical Activity: Not on file  Stress: Not on file  Social Connections: Not on file   Vitals:   08/10/20 1211  BP: 140/90  Pulse: 84  Resp: 16  Temp: 98.4 F (36.9 C)  SpO2: 96%   Body mass index is 32.68 kg/m.  Wt Readings from Last 3 Encounters:  08/10/20 193 lb 6.4 oz (87.7 kg)  03/16/20 184 lb 3.2 oz (83.6 kg)  09/18/19 191 lb 4.8 oz (86.8 kg)   Physical Exam Vitals and nursing note reviewed.  Constitutional:      General: She is not in acute distress.    Appearance: She is well-developed.  HENT:     Head: Normocephalic and atraumatic.     Right Ear: Hearing, tympanic membrane, ear canal and external ear normal.     Left Ear: Hearing, tympanic membrane, ear canal and external ear normal.     Mouth/Throat:     Mouth: Mucous membranes are moist.     Pharynx: Oropharynx is clear. Uvula midline.  Eyes:     Extraocular Movements: Extraocular movements intact.     Conjunctiva/sclera: Conjunctivae normal.     Pupils: Pupils are equal, round, and reactive to light.  Neck:     Thyroid: No thyromegaly.     Trachea: No tracheal deviation.  Cardiovascular:     Rate and Rhythm: Normal rate and regular rhythm.     Pulses:          Dorsalis pedis pulses are 2+ on the right side and 2+ on the left side.      Heart sounds: Murmur (RUSB and LUSB SEM I/VI) heard.  Pulmonary:     Effort: Pulmonary effort is normal. No respiratory distress.     Breath sounds: Normal breath sounds. No decreased breath sounds, wheezing, rhonchi or rales.  Chest:  Breasts:    Right: No supraclavicular adenopathy.     Left: No supraclavicular adenopathy.  Abdominal:     Palpations: Abdomen is soft. There is no hepatomegaly or mass.     Tenderness: There is no abdominal tenderness.  Genitourinary:    Comments: Deferred to gyn. Musculoskeletal:  Comments: No major deformity or signs of synovitis appreciated.  Lymphadenopathy:     Cervical: No cervical adenopathy.     Upper Body:     Right upper body: No supraclavicular adenopathy.     Left upper body: No supraclavicular adenopathy.  Skin:    General: Skin is warm.     Findings: No erythema or rash.  Neurological:     General: No focal deficit present.     Mental Status: She is alert and oriented to person, place, and time.     Cranial Nerves: No cranial nerve deficit.     Coordination: Coordination normal.     Gait: Gait normal.     Deep Tendon Reflexes:     Reflex Scores:      Bicep reflexes are 2+ on the right side and 2+ on the left side.      Patellar reflexes are 2+ on the right side and 2+ on the left side. Psychiatric:     Comments: Well groomed, good eye contact.   ASSESSMENT AND PLAN:  Natalie Gilbert was here today annual physical examination and SOB.  Orders Placed This Encounter  Procedures   Basic metabolic panel   Hemoglobin A1c   Lipid panel   CBC   TSH   Hepatitis C antibody   EKG 12-Lead   Lab Results  Component Value Date   TSH 2.15 08/12/2020   Lab Results  Component Value Date   HGBA1C 5.6 08/12/2020   Lab Results  Component Value Date   WBC 12.1 (H) 08/12/2020   HGB 11.8 (L) 08/12/2020   HCT 35.9 (L) 08/12/2020   MCV 88.8 08/12/2020   PLT 376.0 08/12/2020   Lab Results  Component Value Date    CREATININE 0.94 08/12/2020   BUN 17 08/12/2020   NA 138 08/12/2020   K 4.7 08/12/2020   CL 105 08/12/2020   CO2 25 08/12/2020   Routine general medical examination at a health care facility We discussed the importance of regular physical activity and healthy diet for prevention of chronic illness and/or complications. Preventive guidelines reviewed. Vaccination up to date. Continue her female preventive care with her gyn.  Next CPE in a year. The 10-year ASCVD risk score Mikey Bussing DC Brooke Bonito., et al., 2013) is: 1.6%   Values used to calculate the score:     Age: 38 years     Sex: Female     Is Non-Hispanic African American: Yes     Diabetic: No     Tobacco smoker: No     Systolic Blood Pressure: 016 mmHg     Is BP treated: No     HDL Cholesterol: 53.1 mg/dL     Total Cholesterol: 225 mg/dL  SOB (shortness of breath) Hx and examination do not suggest a serious process. ? Deconditioning. EKG today: Normal SR, normal axis, unspecific T wave abnormalities. No other EKG for comparison. Hx of heart murmur, she has not had studies done. Echo will be arranged. Instructed about warning signs.  Screening for endocrine, metabolic and immunity disorder -     Basic metabolic panel; Future -     Hemoglobin A1c; Future  Hyperlipidemia, unspecified hyperlipidemia type Low fat diet recommended for now. Further recommendations according to FLP result.  Elevated blood pressure reading Recommend monitoring BP regularly. Wt loss will help.  Encounter for HCV screening test for low risk patient -     Hepatitis C antibody; Future  Return in 3 months (on 11/10/2020) for  BP,SOB,wt. Labs on 08/12/20/fasting.  Brailynn Breth G. Martinique, MD  [facility]. [place] office.   Today you have you routine preventive visit. A few things to remember from today's visit:  Routine general medical examination at a health care facility  SOB (shortness of breath) - Plan: EKG 12-Lead, CBC, TSH  Screening  for endocrine, metabolic and immunity disorder - Plan: Basic metabolic panel, Hemoglobin A1c  Hyperlipidemia, unspecified hyperlipidemia type - Plan: Lipid panel  Elevated blood pressure reading  Encounter for HCV screening test for low risk patient - Plan: Hepatitis C antibody  Do not use My Chart to request refills or for acute issues that need immediate attention.   I do not think your shortness of breath is concerning at this time, could be related with weight gain.  I am thinking weight loss will help. Monitor blood pressure at home, today was a little bit elevated.  Please be sure medication list is accurate. If a new problem present, please set up appointment sooner than planned today.  At least 150 minutes of moderate exercise per week, daily brisk walking for 15-30 min is a good exercise option. Healthy diet low in saturated (animal) fats and sweets and consisting of fresh fruits and vegetables, lean meats such as fish and white chicken and whole grains.  These are some of recommendations for screening depending of age and risk factors:  - Vaccines:  Tdap vaccine every 10 years.  Shingles vaccine recommended at age 61, could be given after 44 years of age but not sure about insurance coverage.   Pneumonia vaccines: Pneumovax at 77. Sometimes Pneumovax is giving earlier if history of smoking, lung disease,diabetes,kidney disease among some.  Screening for diabetes at age 42 and every 3 years.  Cervical cancer prevention:  Pap smear starts at 44 years of age and continues periodically until 44 years old in low risk women. Pap smear every 3 years between 50 and 29 years old. Pap smear every 3-5 years between women 82 and older if pap smear negative and HPV screening negative.   -Breast cancer: Mammogram: There is disagreement between experts about when to start screening in low risk asymptomatic female but recent recommendations are to start screening at 54 and not later  than 44 years old , every 1-2 years and after 44 yo q 2 years. Screening is recommended until 44 years old but some women can continue screening depending of healthy issues.  Colon cancer screening: Has been recently changed to 44 yo. Insurance may not cover until you are 44 years old. Screening is recommended until 44 years old.  Cholesterol disorder screening at age 23 and every 3 years. N/A  Also recommended:  Dental visit- Brush and floss your teeth twice daily; visit your dentist twice a year. Eye doctor- Get an eye exam at least every 2 years. Helmet use- Always wear a helmet when riding a bicycle, motorcycle, rollerblading or skateboarding. Safe sex- If you may be exposed to sexually transmitted infections, use a condom. Seat belts- Seat belts can save your live; always wear one. Smoke/Carbon Monoxide detectors- These detectors need to be installed on the appropriate level of your home. Replace batteries at least once a year. Skin cancer- When out in the sun please cover up and use sunscreen 15 SPF or higher. Violence- If anyone is threatening or hurting you, please tell your healthcare provider.  Drink alcohol in moderation- Limit alcohol intake to one drink or less per day. Never drink and drive. Calcium  supplementation 1000 to 1200 mg daily, ideally through your diet.  Vitamin D supplementation 800 units daily.

## 2020-08-12 ENCOUNTER — Other Ambulatory Visit: Payer: Self-pay

## 2020-08-12 ENCOUNTER — Other Ambulatory Visit (INDEPENDENT_AMBULATORY_CARE_PROVIDER_SITE_OTHER): Payer: 59

## 2020-08-12 ENCOUNTER — Ambulatory Visit: Payer: 59 | Admitting: Family Medicine

## 2020-08-12 DIAGNOSIS — Z1159 Encounter for screening for other viral diseases: Secondary | ICD-10-CM

## 2020-08-12 DIAGNOSIS — E785 Hyperlipidemia, unspecified: Secondary | ICD-10-CM | POA: Diagnosis not present

## 2020-08-12 DIAGNOSIS — Z13228 Encounter for screening for other metabolic disorders: Secondary | ICD-10-CM

## 2020-08-12 DIAGNOSIS — Z130 Encounter for screening for diseases of the blood and blood-forming organs and certain disorders involving the immune mechanism: Secondary | ICD-10-CM

## 2020-08-12 DIAGNOSIS — Z1329 Encounter for screening for other suspected endocrine disorder: Secondary | ICD-10-CM | POA: Diagnosis not present

## 2020-08-12 DIAGNOSIS — R0602 Shortness of breath: Secondary | ICD-10-CM

## 2020-08-12 LAB — LIPID PANEL
Cholesterol: 225 mg/dL — ABNORMAL HIGH (ref 0–200)
HDL: 53.1 mg/dL (ref >39.00)
NonHDL: 171.4
Total CHOL/HDL Ratio: 4
Triglycerides: 208 mg/dL — ABNORMAL HIGH (ref 0.0–149.0)
VLDL: 41.6 mg/dL — ABNORMAL HIGH (ref 0.0–40.0)

## 2020-08-12 LAB — CBC
HCT: 35.9 % — ABNORMAL LOW (ref 36.0–46.0)
Hemoglobin: 11.8 g/dL — ABNORMAL LOW (ref 12.0–15.0)
MCHC: 33 g/dL (ref 30.0–36.0)
MCV: 88.8 fl (ref 78.0–100.0)
Platelets: 376 10*3/uL (ref 150.0–400.0)
RBC: 4.04 Mil/uL (ref 3.87–5.11)
RDW: 13.6 % (ref 11.5–15.5)
WBC: 12.1 10*3/uL — ABNORMAL HIGH (ref 4.0–10.5)

## 2020-08-12 LAB — BASIC METABOLIC PANEL
BUN: 17 mg/dL (ref 6–23)
CO2: 25 mEq/L (ref 19–32)
Calcium: 9.4 mg/dL (ref 8.4–10.5)
Chloride: 105 mEq/L (ref 96–112)
Creatinine, Ser: 0.94 mg/dL (ref 0.40–1.20)
GFR: 74.13 mL/min (ref >60.00)
Glucose, Bld: 98 mg/dL (ref 70–99)
Potassium: 4.7 mEq/L (ref 3.5–5.1)
Sodium: 138 mEq/L (ref 135–145)

## 2020-08-12 LAB — LDL CHOLESTEROL, DIRECT: Direct LDL: 141 mg/dL

## 2020-08-12 LAB — HEMOGLOBIN A1C: Hgb A1c MFr Bld: 5.6 % (ref 4.6–6.5)

## 2020-08-12 LAB — TSH: TSH: 2.15 u[IU]/mL (ref 0.35–4.50)

## 2020-08-13 LAB — HEPATITIS C ANTIBODY
Hepatitis C Ab: NONREACTIVE
SIGNAL TO CUT-OFF: 0.01 (ref <1.00)

## 2020-08-20 ENCOUNTER — Other Ambulatory Visit (HOSPITAL_COMMUNITY): Payer: 59

## 2020-08-26 ENCOUNTER — Other Ambulatory Visit (HOSPITAL_COMMUNITY): Payer: 59

## 2020-09-02 ENCOUNTER — Other Ambulatory Visit (HOSPITAL_COMMUNITY): Payer: Self-pay

## 2020-09-02 MED FILL — Dupilumab Subcutaneous Soln Prefilled Syringe 300 MG/2ML: SUBCUTANEOUS | 28 days supply | Qty: 4 | Fill #3 | Status: AC

## 2020-09-14 ENCOUNTER — Telehealth: Payer: Self-pay | Admitting: Pharmacist

## 2020-09-14 NOTE — Telephone Encounter (Signed)
Called patient to schedule an appointment for the [facility]. I was unable to reach the patient so I left a HIPAA-compliant message requesting that the patient return my call.   Luke Van Ausdall, PharmD, BCACP, CPP Clinical Pharmacist [facility] [phone]  

## 2020-09-15 ENCOUNTER — Ambulatory Visit (HOSPITAL_COMMUNITY): Payer: 59 | Attending: Internal Medicine

## 2020-09-15 ENCOUNTER — Other Ambulatory Visit: Payer: Self-pay

## 2020-09-15 DIAGNOSIS — R0602 Shortness of breath: Secondary | ICD-10-CM

## 2020-09-15 DIAGNOSIS — R011 Cardiac murmur, unspecified: Secondary | ICD-10-CM

## 2020-09-15 LAB — ECHOCARDIOGRAM COMPLETE
Area-P 1/2: 2.8 cm2
S' Lateral: 3.1 cm

## 2020-09-23 ENCOUNTER — Other Ambulatory Visit (HOSPITAL_COMMUNITY): Payer: Self-pay

## 2020-09-25 ENCOUNTER — Other Ambulatory Visit (HOSPITAL_COMMUNITY): Payer: Self-pay

## 2020-09-30 ENCOUNTER — Other Ambulatory Visit: Payer: Self-pay

## 2020-09-30 ENCOUNTER — Other Ambulatory Visit (HOSPITAL_COMMUNITY): Payer: Self-pay

## 2020-10-05 ENCOUNTER — Other Ambulatory Visit (HOSPITAL_COMMUNITY): Payer: Self-pay

## 2020-10-14 ENCOUNTER — Other Ambulatory Visit (HOSPITAL_COMMUNITY): Payer: Self-pay

## 2020-10-14 DIAGNOSIS — L299 Pruritus, unspecified: Secondary | ICD-10-CM | POA: Diagnosis not present

## 2020-10-14 DIAGNOSIS — L209 Atopic dermatitis, unspecified: Secondary | ICD-10-CM | POA: Diagnosis not present

## 2020-10-14 MED ORDER — HYDROCORTISONE 2.5 % EX CREA
1.0000 "application " | TOPICAL_CREAM | Freq: Two times a day (BID) | CUTANEOUS | 1 refills | Status: DC
  Filled 2020-10-14 – 2020-10-15 (×2): qty 30, 14d supply, fill #0
  Filled 2020-12-11: qty 30, 14d supply, fill #1

## 2020-10-14 MED ORDER — DUPIXENT 300 MG/2ML ~~LOC~~ SOSY: 300.0000 mg | PREFILLED_SYRINGE | SUBCUTANEOUS | 6 refills | Status: DC

## 2020-10-15 ENCOUNTER — Other Ambulatory Visit: Payer: Self-pay | Admitting: Pharmacist

## 2020-10-15 ENCOUNTER — Telehealth: Payer: Self-pay | Admitting: Pharmacist

## 2020-10-15 ENCOUNTER — Other Ambulatory Visit (HOSPITAL_COMMUNITY): Payer: Self-pay

## 2020-10-15 ENCOUNTER — Encounter: Payer: Self-pay | Admitting: Pharmacist

## 2020-10-15 ENCOUNTER — Telehealth (HOSPITAL_BASED_OUTPATIENT_CLINIC_OR_DEPARTMENT_OTHER): Payer: 59 | Admitting: Pharmacist

## 2020-10-15 MED ORDER — DUPIXENT 300 MG/2ML ~~LOC~~ SOSY
300.0000 mg | PREFILLED_SYRINGE | SUBCUTANEOUS | 6 refills | Status: DC
  Filled 2020-10-15: qty 4, 28d supply, fill #0
  Filled 2020-11-05: qty 4, 28d supply, fill #1
  Filled 2020-12-03: qty 4, 28d supply, fill #2
  Filled 2021-01-01: qty 4, 28d supply, fill #3
  Filled 2021-01-28: qty 4, 28d supply, fill #4
  Filled 2021-02-26: qty 4, 28d supply, fill #5
  Filled 2021-03-24: qty 4, 28d supply, fill #6

## 2020-10-15 NOTE — Telephone Encounter (Signed)
Called patient to schedule an appointment for the [facility]. I was unable to reach the patient so I left a HIPAA-compliant message requesting that the patient return my call.   

## 2020-10-15 NOTE — Progress Notes (Addendum)
   S: Patient presents for review of their specialty medication therapy.  Patient is currently taking Dupixent for atopic dermatitis. Patient is managed by Junie Panning Gray/Dr. Haverstock for this.   Adherence: denies any missed doses except for her most recent since she ran out of medication.  Efficacy: reports that it continues to work well.  Dosing: 300 mg every other week.  Dose adjustments: Renal: no dose adjustments (has not been studied) Hepatic: no dose adjustments (has not been studied)  Patient denies any adverse effects.  O:     Lab Results  Component Value Date   WBC 12.1 (H) 08/12/2020   HGB 11.8 (L) 08/12/2020   HCT 35.9 (L) 08/12/2020   MCV 88.8 08/12/2020   PLT 376.0 08/12/2020      Chemistry      Component Value Date/Time   NA 138 08/12/2020 0730   K 4.7 08/12/2020 0730   CL 105 08/12/2020 0730   CO2 25 08/12/2020 0730   BUN 17 08/12/2020 0730   CREATININE 0.94 08/12/2020 0730      Component Value Date/Time   CALCIUM 9.4 08/12/2020 0730   ALKPHOS 55 10/28/2014 1318   AST 23 10/28/2014 1318   ALT 19 10/28/2014 1318   BILITOT 0.3 10/28/2014 1318       A/P: 1. Medication review: Patient currently on [place] for atopic dermatitis. Patient denies any adverse effects and has no questions about Dupixent at this time. Has been stable on medication for the last few years. Patient encouraged to reach out with any questions. Will follow up in one year.  Christella Hartigan, PharmD, BCPS, BCACP, CPP Clinical Pharmacist Practitioner

## 2020-11-05 ENCOUNTER — Other Ambulatory Visit (HOSPITAL_COMMUNITY): Payer: Self-pay

## 2020-11-10 ENCOUNTER — Other Ambulatory Visit (HOSPITAL_COMMUNITY): Payer: Self-pay

## 2020-11-11 ENCOUNTER — Other Ambulatory Visit (HOSPITAL_COMMUNITY): Payer: Self-pay

## 2020-12-03 ENCOUNTER — Other Ambulatory Visit (HOSPITAL_COMMUNITY): Payer: Self-pay

## 2020-12-07 ENCOUNTER — Other Ambulatory Visit (HOSPITAL_COMMUNITY): Payer: Self-pay

## 2020-12-11 ENCOUNTER — Other Ambulatory Visit (HOSPITAL_COMMUNITY): Payer: Self-pay

## 2020-12-18 DIAGNOSIS — R059 Cough, unspecified: Secondary | ICD-10-CM | POA: Diagnosis not present

## 2021-01-01 ENCOUNTER — Other Ambulatory Visit (HOSPITAL_COMMUNITY): Payer: Self-pay

## 2021-01-28 ENCOUNTER — Other Ambulatory Visit (HOSPITAL_COMMUNITY): Payer: Self-pay

## 2021-02-01 ENCOUNTER — Other Ambulatory Visit (HOSPITAL_COMMUNITY): Payer: Self-pay

## 2021-02-23 ENCOUNTER — Other Ambulatory Visit (HOSPITAL_COMMUNITY): Payer: Self-pay

## 2021-02-26 ENCOUNTER — Other Ambulatory Visit (HOSPITAL_COMMUNITY): Payer: Self-pay

## 2021-03-01 ENCOUNTER — Other Ambulatory Visit (HOSPITAL_COMMUNITY): Payer: Self-pay

## 2021-03-01 MED ORDER — HYDROCORTISONE 2.5 % EX CREA
TOPICAL_CREAM | CUTANEOUS | 1 refills | Status: DC
  Filled 2021-03-01: qty 30, 14d supply, fill #0
  Filled 2022-01-01: qty 30, 14d supply, fill #1

## 2021-03-02 ENCOUNTER — Other Ambulatory Visit (HOSPITAL_COMMUNITY): Payer: Self-pay

## 2021-03-04 ENCOUNTER — Other Ambulatory Visit (HOSPITAL_COMMUNITY): Payer: Self-pay

## 2021-03-23 ENCOUNTER — Other Ambulatory Visit (HOSPITAL_COMMUNITY): Payer: Self-pay

## 2021-03-24 ENCOUNTER — Other Ambulatory Visit (HOSPITAL_COMMUNITY): Payer: Self-pay

## 2021-03-25 ENCOUNTER — Other Ambulatory Visit (HOSPITAL_COMMUNITY): Payer: Self-pay

## 2021-03-26 ENCOUNTER — Encounter: Payer: Self-pay | Admitting: Family Medicine

## 2021-03-29 NOTE — Telephone Encounter (Signed)
She needs a f/u appt to discuss treatment options and type of accommodations she is requesting. Thanks, BJ

## 2021-03-30 ENCOUNTER — Telehealth (INDEPENDENT_AMBULATORY_CARE_PROVIDER_SITE_OTHER): Payer: 59 | Admitting: Family Medicine

## 2021-03-30 ENCOUNTER — Encounter: Payer: Self-pay | Admitting: Family Medicine

## 2021-03-30 ENCOUNTER — Other Ambulatory Visit (HOSPITAL_COMMUNITY): Payer: Self-pay

## 2021-03-30 ENCOUNTER — Other Ambulatory Visit: Payer: Self-pay

## 2021-03-30 DIAGNOSIS — F4010 Social phobia, unspecified: Secondary | ICD-10-CM

## 2021-03-30 DIAGNOSIS — F411 Generalized anxiety disorder: Secondary | ICD-10-CM

## 2021-03-30 MED ORDER — ESCITALOPRAM OXALATE 10 MG PO TABS
10.0000 mg | ORAL_TABLET | Freq: Every day | ORAL | 1 refills | Status: DC
  Filled 2021-03-30: qty 30, 30d supply, fill #0
  Filled 2021-04-26: qty 30, 30d supply, fill #1

## 2021-03-30 MED ORDER — PROPRANOLOL HCL 20 MG PO TABS
20.0000 mg | ORAL_TABLET | Freq: Two times a day (BID) | ORAL | 1 refills | Status: DC | PRN
  Filled 2021-03-30: qty 30, 15d supply, fill #0

## 2021-03-30 NOTE — Assessment & Plan Note (Addendum)
Problem seems to be getting worse. We discussed treatment options, I do not recommend benzodiazepines at this time. She would like to try Lexapro 10 mg, prescription sent.  We discussed some side effects. For now she is not interested in CBT. Instructed to let me know in about 6 to 8 weeks if medication is helping. Instructed about warning signs.  She will pick up letter for school tomorrow, which will contain Dx and recommendations as she requested.

## 2021-03-30 NOTE — Assessment & Plan Note (Signed)
She would like to resume propranolol 20 mg, recommend taking it max twice daily for social anxiety. We discussed some side effects. CBT will also help.

## 2021-03-30 NOTE — Progress Notes (Signed)
Virtual Visit via Video Note I connected with Natalie Gilbert on 03/30/21 by a video enabled telemedicine application and verified that I am speaking with the correct person using two identifiers.  Location patient: home Location provider:work office Persons participating in the virtual visit: patient, provider  I discussed the limitations of evaluation and management by telemedicine and the availability of in person appointments. The patient expressed understanding and agreed to proceed.  Chief Complaint  Patient presents with   Follow-up   HPI: [name] is a 46 yo female with hx of anxiety and depression who is being seen today to follow on anxiety. She has been under a lot of stress at work and school. 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 She is requesting a letter for school stating that she has hx of anxiety and recommendations given on her on her last follow up. She was seen in 07/2020 for her CPE. On 03/16/2020 she reported having episodes of acute anxiety exacerbated by work. Anxiety also exacerbated by speaking in public. At that time she reported episodes when her heart starts pounding, hyperventilating, face flashes, feeling had, and she really Propranolol 20 mg twice daily prn and Lexapro 10 mg were recommended as well as CB, a list of providers at [facility] was given.  She did not take medications, afraid of side effects. She did not establish with psychotherapist.  Negative for suicidal thoughts.  ROS: See pertinent positives and negatives per HPI.  Past Medical History:  Diagnosis Date   Depression    Eczema    Heart murmur     History reviewed. No pertinent surgical history.  Family History  Problem Relation Age of Onset   Hypertension Mother    Heart murmur Father    Hypertension Brother    Diabetes Maternal Grandmother    Breast cancer Paternal Grandmother     Social History   Socioeconomic History    Marital status: Single    Spouse name: Not on file   Number of children: Not on file   Years of education: Not on file   Highest education level: Not on file  Occupational History   Not on file  Tobacco Use   Smoking status: Never   Smokeless tobacco: Never  Vaping Use   Vaping Use: Never used  Substance and Sexual Activity   Alcohol use: No   Drug use: No   Sexual activity: Yes    Birth control/protection: Spermicide  Other Topics Concern   Not on file  Social History Narrative   Not on file   Social Determinants of Health   Financial Resource Strain: Not on file  Food Insecurity: Not on file  Transportation Needs: Not on file  Physical Activity: Not on file  Stress: Not on file  Social Connections: Not on file  Intimate Partner Violence: Not on file    Current Outpatient Medications:    cetirizine (ZYRTEC) 10 MG tablet, TAKE 1 TABLET BY MOUTH EVERY DAY, Disp: 30 tablet, Rfl: 0   desonide (DESOWEN) 0.05 % cream, desonide 0.05 % topical cream  APPLY SPARINGLY AND RUB GENTLY INTO THE AFFECTED AREA(S) BY TOPICAL ROUTE 2 TIMES PER DAY, Disp: , Rfl:    dupilumab (DUPIXENT) 300 MG/2ML prefilled syringe, USE 1 INJECTION EVERY OTHER WEEK UNDER THE SKIN, Disp: 4 mL, Rfl: 6   escitalopram (LEXAPRO) 10 MG tablet, Take 1 tablet (10 mg total) by mouth daily., Disp: 30 tablet, Rfl: 1  fluocinonide ointment (LIDEX) 0.05 %, fluocinonide 0.05 % topical ointment  APPLY TO AFFECTED AREA OF HANDS TWICE A DAY FOR 14 DAYS, Disp: , Rfl:    hydrocortisone 2.5 % cream, Apply topically 2 times daily as directed for 14 days, Disp: 30 g, Rfl: 1   MICROGESTIN FE 1/20 1-20 MG-MCG tablet, , Disp: , Rfl:    propranolol (INDERAL) 20 MG tablet, Take 1 tablet (20 mg total) by mouth 2 (two) times daily as needed., Disp: 30 tablet, Rfl: 1  EXAM:  VITALS per patient if applicable:Ht 5' 4.5" (7.209 m)    BMI 32.68 kg/m   GENERAL: alert, oriented, appears well and in no acute distress  HEENT: atraumatic,  conjunctiva clear, no obvious abnormalities on inspection.  NECK: normal movements of the head and neck  LUNGS: on inspection no signs of respiratory distress, breathing rate appears normal, no obvious gross SOB, gasping or wheezing  CV: no obvious cyanosis  MS: moves all visible extremities without noticeable abnormality  PSYCH/NEURO: pleasant and cooperative, no obvious depression, + anxious.Speech and thought processing grossly intact  ASSESSMENT AND PLAN:  Discussed the following assessment and plan:  Social anxiety disorder - Plan: propranolol (INDERAL) 20 MG tablet  GAD (generalized anxiety disorder) - Plan: escitalopram (LEXAPRO) 10 MG tablet  Social anxiety disorder She would like to resume propranolol 20 mg, recommend taking it max twice daily for social anxiety. We discussed some side effects. CBT will also help.  GAD (generalized anxiety disorder) Problem seems to be getting worse. We discussed treatment options, I do not recommend benzodiazepines at this time. She would like to try Lexapro 10 mg, prescription sent.  We discussed some side effects. For now she is not interested in CBT. Instructed to let me know in about 6 to 8 weeks if medication is helping. Instructed about warning signs.  She will pick up letter for school tomorrow, which will contain Dx and recommendations as she requested.  I discussed the assessment and treatment plan with the patient. The patient was provided an opportunity to ask questions and all were answered. The patient agreed with the plan and demonstrated an understanding of the instructions.  Return in about 4 months (around 07/28/2021) for cpe. Betty G. Martinique, MD  [facility]. [place] office.

## 2021-03-31 ENCOUNTER — Other Ambulatory Visit (HOSPITAL_COMMUNITY): Payer: Self-pay

## 2021-04-02 ENCOUNTER — Ambulatory Visit: Payer: 59 | Admitting: Family Medicine

## 2021-04-09 ENCOUNTER — Other Ambulatory Visit: Payer: Self-pay | Admitting: Pharmacist

## 2021-04-09 ENCOUNTER — Other Ambulatory Visit (HOSPITAL_COMMUNITY): Payer: Self-pay

## 2021-04-09 DIAGNOSIS — L280 Lichen simplex chronicus: Secondary | ICD-10-CM | POA: Diagnosis not present

## 2021-04-09 DIAGNOSIS — Z23 Encounter for immunization: Secondary | ICD-10-CM | POA: Diagnosis not present

## 2021-04-09 DIAGNOSIS — L209 Atopic dermatitis, unspecified: Secondary | ICD-10-CM | POA: Diagnosis not present

## 2021-04-09 MED ORDER — PIMECROLIMUS 1 % EX CREA
TOPICAL_CREAM | CUTANEOUS | 0 refills | Status: DC
  Filled 2021-04-09: qty 30, 30d supply, fill #0

## 2021-04-09 MED ORDER — DUPIXENT 300 MG/2ML ~~LOC~~ SOSY
PREFILLED_SYRINGE | SUBCUTANEOUS | 6 refills | Status: DC
  Filled 2021-04-09: qty 4, fill #0
  Filled 2021-04-13 – 2021-04-22 (×2): qty 4, 28d supply, fill #0
  Filled 2021-05-17: qty 4, 28d supply, fill #1
  Filled 2021-06-15: qty 4, 28d supply, fill #2
  Filled 2021-07-12: qty 4, 28d supply, fill #3
  Filled 2021-08-05: qty 4, 28d supply, fill #4
  Filled 2021-09-07: qty 4, 28d supply, fill #5
  Filled 2021-10-04: qty 4, 28d supply, fill #6

## 2021-04-09 MED ORDER — DUPIXENT 300 MG/2ML ~~LOC~~ SOSY: PREFILLED_SYRINGE | SUBCUTANEOUS | 6 refills | Status: DC

## 2021-04-09 MED ORDER — BETAMETHASONE DIPROPIONATE AUG 0.05 % EX OINT
TOPICAL_OINTMENT | CUTANEOUS | 0 refills | Status: DC
  Filled 2021-04-09: qty 50, 20d supply, fill #0

## 2021-04-12 ENCOUNTER — Other Ambulatory Visit (HOSPITAL_COMMUNITY): Payer: Self-pay

## 2021-04-12 MED ORDER — CLOBETASOL PROPIONATE 0.05 % EX OINT
TOPICAL_OINTMENT | CUTANEOUS | 0 refills | Status: AC
  Filled 2021-04-12: qty 60, 20d supply, fill #0

## 2021-04-12 MED ORDER — TACROLIMUS 0.1 % EX OINT
TOPICAL_OINTMENT | CUTANEOUS | 0 refills | Status: DC
  Filled 2021-04-12: qty 30, 10d supply, fill #0

## 2021-04-13 ENCOUNTER — Other Ambulatory Visit (HOSPITAL_COMMUNITY): Payer: Self-pay

## 2021-04-19 ENCOUNTER — Other Ambulatory Visit (HOSPITAL_COMMUNITY): Payer: Self-pay

## 2021-04-22 ENCOUNTER — Other Ambulatory Visit (HOSPITAL_COMMUNITY): Payer: Self-pay

## 2021-04-26 ENCOUNTER — Other Ambulatory Visit (HOSPITAL_COMMUNITY): Payer: Self-pay

## 2021-04-30 ENCOUNTER — Other Ambulatory Visit (HOSPITAL_COMMUNITY): Payer: Self-pay

## 2021-05-10 ENCOUNTER — Other Ambulatory Visit (HOSPITAL_COMMUNITY): Payer: Self-pay

## 2021-05-14 ENCOUNTER — Other Ambulatory Visit (HOSPITAL_COMMUNITY): Payer: Self-pay

## 2021-05-17 ENCOUNTER — Other Ambulatory Visit (HOSPITAL_COMMUNITY): Payer: Self-pay

## 2021-05-21 ENCOUNTER — Other Ambulatory Visit: Payer: Self-pay | Admitting: Family Medicine

## 2021-05-21 ENCOUNTER — Other Ambulatory Visit (HOSPITAL_COMMUNITY): Payer: Self-pay

## 2021-05-21 DIAGNOSIS — F411 Generalized anxiety disorder: Secondary | ICD-10-CM

## 2021-05-21 MED ORDER — ESCITALOPRAM OXALATE 10 MG PO TABS
10.0000 mg | ORAL_TABLET | Freq: Every day | ORAL | 3 refills | Status: DC
  Filled 2021-05-21 – 2021-05-29 (×2): qty 30, 30d supply, fill #0
  Filled 2021-06-25: qty 30, 30d supply, fill #1
  Filled 2021-07-28: qty 30, 30d supply, fill #2
  Filled 2021-08-22 – 2021-09-15 (×2): qty 30, 30d supply, fill #3

## 2021-05-29 ENCOUNTER — Other Ambulatory Visit (HOSPITAL_COMMUNITY): Payer: Self-pay

## 2021-06-15 ENCOUNTER — Other Ambulatory Visit (HOSPITAL_COMMUNITY): Payer: Self-pay

## 2021-06-16 ENCOUNTER — Other Ambulatory Visit (HOSPITAL_COMMUNITY): Payer: Self-pay

## 2021-06-25 ENCOUNTER — Other Ambulatory Visit (HOSPITAL_COMMUNITY): Payer: Self-pay

## 2021-07-01 DIAGNOSIS — Z1389 Encounter for screening for other disorder: Secondary | ICD-10-CM | POA: Diagnosis not present

## 2021-07-01 DIAGNOSIS — Z01411 Encounter for gynecological examination (general) (routine) with abnormal findings: Secondary | ICD-10-CM | POA: Diagnosis not present

## 2021-07-01 DIAGNOSIS — N852 Hypertrophy of uterus: Secondary | ICD-10-CM | POA: Diagnosis not present

## 2021-07-01 DIAGNOSIS — Z1231 Encounter for screening mammogram for malignant neoplasm of breast: Secondary | ICD-10-CM | POA: Diagnosis not present

## 2021-07-01 DIAGNOSIS — Z202 Contact with and (suspected) exposure to infections with a predominantly sexual mode of transmission: Secondary | ICD-10-CM | POA: Diagnosis not present

## 2021-07-01 DIAGNOSIS — D259 Leiomyoma of uterus, unspecified: Secondary | ICD-10-CM | POA: Diagnosis not present

## 2021-07-01 DIAGNOSIS — R3121 Asymptomatic microscopic hematuria: Secondary | ICD-10-CM | POA: Diagnosis not present

## 2021-07-01 DIAGNOSIS — Z130 Encounter for screening for diseases of the blood and blood-forming organs and certain disorders involving the immune mechanism: Secondary | ICD-10-CM | POA: Diagnosis not present

## 2021-07-01 DIAGNOSIS — Z6830 Body mass index (BMI) 30.0-30.9, adult: Secondary | ICD-10-CM | POA: Diagnosis not present

## 2021-07-12 ENCOUNTER — Other Ambulatory Visit (HOSPITAL_COMMUNITY): Payer: Self-pay

## 2021-07-14 ENCOUNTER — Other Ambulatory Visit (HOSPITAL_COMMUNITY): Payer: Self-pay

## 2021-07-15 DIAGNOSIS — R3121 Asymptomatic microscopic hematuria: Secondary | ICD-10-CM | POA: Diagnosis not present

## 2021-07-28 ENCOUNTER — Other Ambulatory Visit (HOSPITAL_COMMUNITY): Payer: Self-pay

## 2021-08-05 ENCOUNTER — Other Ambulatory Visit (HOSPITAL_COMMUNITY): Payer: Self-pay

## 2021-08-12 ENCOUNTER — Other Ambulatory Visit (HOSPITAL_COMMUNITY): Payer: Self-pay

## 2021-08-23 ENCOUNTER — Other Ambulatory Visit (HOSPITAL_COMMUNITY): Payer: Self-pay

## 2021-09-04 ENCOUNTER — Other Ambulatory Visit (HOSPITAL_COMMUNITY): Payer: Self-pay

## 2021-09-07 ENCOUNTER — Other Ambulatory Visit (HOSPITAL_COMMUNITY): Payer: Self-pay

## 2021-09-13 ENCOUNTER — Other Ambulatory Visit (HOSPITAL_COMMUNITY): Payer: Self-pay

## 2021-09-15 ENCOUNTER — Other Ambulatory Visit (HOSPITAL_COMMUNITY): Payer: Self-pay

## 2021-10-04 ENCOUNTER — Other Ambulatory Visit (HOSPITAL_COMMUNITY): Payer: Self-pay

## 2021-10-04 DIAGNOSIS — L819 Disorder of pigmentation, unspecified: Secondary | ICD-10-CM | POA: Diagnosis not present

## 2021-10-04 DIAGNOSIS — L209 Atopic dermatitis, unspecified: Secondary | ICD-10-CM | POA: Diagnosis not present

## 2021-10-04 MED ORDER — TACROLIMUS 0.1 % EX OINT
TOPICAL_OINTMENT | CUTANEOUS | 0 refills | Status: DC
  Filled 2021-10-04: qty 30, 30d supply, fill #0

## 2021-10-11 ENCOUNTER — Other Ambulatory Visit (HOSPITAL_COMMUNITY): Payer: Self-pay

## 2021-10-19 ENCOUNTER — Telehealth: Payer: Self-pay | Admitting: Pharmacist

## 2021-10-19 NOTE — Telephone Encounter (Signed)
Called patient to schedule an appointment for the [facility] [employer] [facility]. I was unable to reach the patient so I left a HIPAA-compliant message requesting that the patient return my call.   Luke Van Ausdall, PharmD, BCACP, CPP Clinical Pharmacist [facility] [phone]  

## 2021-10-28 ENCOUNTER — Other Ambulatory Visit (HOSPITAL_COMMUNITY): Payer: Self-pay

## 2021-11-02 ENCOUNTER — Other Ambulatory Visit (HOSPITAL_COMMUNITY): Payer: Self-pay

## 2021-11-06 ENCOUNTER — Other Ambulatory Visit (HOSPITAL_COMMUNITY): Payer: Self-pay

## 2021-11-08 ENCOUNTER — Other Ambulatory Visit (HOSPITAL_COMMUNITY): Payer: Self-pay

## 2021-11-10 ENCOUNTER — Other Ambulatory Visit (HOSPITAL_COMMUNITY): Payer: Self-pay

## 2021-11-10 MED ORDER — DUPIXENT 300 MG/2ML ~~LOC~~ SOSY
300.0000 mg | PREFILLED_SYRINGE | SUBCUTANEOUS | 6 refills | Status: DC
  Filled 2021-11-10: qty 4, 28d supply, fill #0

## 2021-11-11 NOTE — Progress Notes (Unsigned)
HPI: Natalie Gilbert is a 46 y.o. female, who is here today for her routine physical.  Last CPE: 08/10/20  She does not have an exercise routine but walks over 10,000 steps at work. Eating healthier, she does nit cook but her boyfriend does.She avoids fried foods. She decreased red meat intake, she is not eating daily vegetables. She has noted some wt loss.  Chronic medical problems: GAD,depression,seasonal allergies,eczema,and HLD among some.  Immunization History  Administered Date(s) Administered   Influenza,inj,Quad PF,6+ Mos 10/28/2016   Influenza,inj,quad, With Preservative 10/28/2016   MMR 06/04/2019   PFIZER(Purple Top)SARS-COV-2 Vaccination 03/01/2019, 03/22/2019, 02/28/2020   PPD Test 06/28/2019   Tdap 10/19/2012, 06/30/2016   Health Maintenance  Topic Date Due   COVID-19 Vaccine (4 - Pfizer risk series) 04/24/2020   INFLUENZA VACCINE  09/21/2021   COLONOSCOPY (Pts 45-38yr Insurance coverage will need to be confirmed)  Never done   PAP SMEAR-Modifier  05/08/2023   TETANUS/TDAP  07/01/2026   Hepatitis C Screening  Completed   HIV Screening  Completed   HPV VACCINES  Aged Out  Established with gyn, last visit 07/01/21.  HLD on non pharmacologic treatment. Lab Results  Component Value Date   CHOL 225 (H) 08/12/2020   HDL 53.10 08/12/2020   LDLCALC 126 (H) 06/04/2019   LDLDIRECT 141.0 08/12/2020   TRIG 208.0 (H) 08/12/2020   CHOLHDL 4 08/12/2020   Iron def anemia: DUB, hx of uterine fibroids. On OCP's.  Lab Results  Component Value Date   WBC 12.1 (H) 08/12/2020   HGB 11.8 (L) 08/12/2020   HCT 35.9 (L) 08/12/2020   MCV 88.8 08/12/2020   PLT 376.0 08/12/2020   GAD: She is on Lexapro, decreased dose from 10 mg to 5 mg and still helping with symptoms. 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  Review of Systems  Constitutional:  Negative for activity change, appetite change and fever.  HENT:  Negative for hearing loss, mouth sores, sore  throat and trouble swallowing.   Eyes:  Negative for redness and visual disturbance.  Respiratory:  Negative for cough, shortness of breath and wheezing.   Cardiovascular:  Negative for chest pain and leg swelling.  Gastrointestinal:  Negative for abdominal pain, nausea and vomiting.       No changes in bowel habits.  Endocrine: Negative for cold intolerance, heat intolerance, polydipsia, polyphagia and polyuria.  Genitourinary:  Positive for menstrual problem. Negative for decreased urine volume, dysuria, hematuria, vaginal bleeding and vaginal discharge.  Musculoskeletal:  Negative for gait problem and myalgias.  Skin:  Negative for color change and rash.  Allergic/Immunologic: Positive for environmental allergies.  Neurological:  Negative for seizures, syncope, weakness and headaches.  Hematological:  Negative for adenopathy. Does not bruise/bleed easily.  Psychiatric/Behavioral:  Negative for confusion. The patient is nervous/anxious.   All other systems reviewed and are negative.  Current Outpatient Medications on File Prior to Visit  Medication Sig Dispense Refill   augmented betamethasone dipropionate (DIPROLENE-AF) 0.05 % ointment Apply on the skin once nightly for up to 30 days as directed. 50 g 0   cetirizine (ZYRTEC) 10 MG tablet TAKE 1 TABLET BY MOUTH EVERY DAY 30 tablet 0   clobetasol ointment (TEMOVATE) 0.05 % Apply to affected areas daily at night 60 g 0   desonide (DESOWEN) 0.05 % cream desonide 0.05 % topical cream  APPLY SPARINGLY AND RUB GENTLY INTO THE AFFECTED AREA(S) BY TOPICAL ROUTE 2 TIMES PER DAY     fluocinonide  ointment (LIDEX) 0.05 % fluocinonide 0.05 % topical ointment  APPLY TO AFFECTED AREA OF HANDS TWICE A DAY FOR 14 DAYS     hydrocortisone 2.5 % cream Apply topically 2 times daily as directed for 14 days 30 g 1   MICROGESTIN FE 1/20 1-20 MG-MCG tablet      pimecrolimus (ELIDEL) 1 % cream Apply topically 2 times a day 30 g 0   propranolol (INDERAL) 20 MG  tablet Take 1 tablet (20 mg total) by mouth 2 (two) times daily as needed. 30 tablet 1   tacrolimus (PROTOPIC) 0.1 % ointment Apply to affected area 2 times a day 30 g 0   tacrolimus (PROTOPIC) 0.1 % ointment Apply topically twice a day 30 g 0   No current facility-administered medications on file prior to visit.   Past Medical History:  Diagnosis Date   Depression    Eczema    Heart murmur    History reviewed. No pertinent surgical history.  No Known Allergies  Family History  Problem Relation Age of Onset   Hypertension Mother    Heart murmur Father    Hypertension Brother    Diabetes Maternal Grandmother    Breast cancer Paternal Grandmother    Social History   Socioeconomic History   Marital status: Single    Spouse name: Not on file   Number of children: Not on file   Years of education: Not on file   Highest education level: Not on file  Occupational History   Not on file  Tobacco Use   Smoking status: Never   Smokeless tobacco: Never  Vaping Use   Vaping Use: Never used  Substance and Sexual Activity   Alcohol use: No   Drug use: No   Sexual activity: Yes    Birth control/protection: Spermicide  Other Topics Concern   Not on file  Social History Narrative   Not on file   Social Determinants of Health   Financial Resource Strain: Not on file  Food Insecurity: Not on file  Transportation Needs: Not on file  Physical Activity: Not on file  Stress: Not on file  Social Connections: Not on file   Vitals:   11/12/21 0936  BP: 136/88  Pulse: 66  Resp: 18  SpO2: 96%  Body mass index is 31.35 kg/m. Wt Readings from Last 3 Encounters:  11/12/21 185 lb 8 oz (84.1 kg)  08/10/20 193 lb 6.4 oz (87.7 kg)  03/16/20 184 lb 3.2 oz (83.6 kg)  Physical Exam Vitals and nursing note reviewed.  Constitutional:      General: She is not in acute distress.    Appearance: She is well-developed.  HENT:     Head: Normocephalic and atraumatic.     Right Ear:  Hearing, tympanic membrane, ear canal and external ear normal.     Left Ear: Hearing, tympanic membrane, ear canal and external ear normal.     Mouth/Throat:     Mouth: Mucous membranes are moist.     Pharynx: Oropharynx is clear. Uvula midline.  Eyes:     Extraocular Movements: Extraocular movements intact.     Conjunctiva/sclera: Conjunctivae normal.     Pupils: Pupils are equal, round, and reactive to light.  Neck:     Thyroid: No thyromegaly.     Trachea: No tracheal deviation.  Cardiovascular:     Rate and Rhythm: Normal rate and regular rhythm.     Pulses:          Dorsalis pedis  pulses are 2+ on the right side and 2+ on the left side.     Heart sounds: Murmur (soft SEM LUSB) heard.  Pulmonary:     Effort: Pulmonary effort is normal. No respiratory distress.     Breath sounds: Normal breath sounds.  Abdominal:     Palpations: Abdomen is soft. There is no hepatomegaly or mass.     Tenderness: There is no abdominal tenderness.  Genitourinary:    Comments: Deferred to gyn. Musculoskeletal:     Comments: No major deformity or signs of synovitis appreciated.  Lymphadenopathy:     Cervical: No cervical adenopathy.     Upper Body:     Right upper body: No supraclavicular adenopathy.     Left upper body: No supraclavicular adenopathy.  Skin:    General: Skin is warm.     Findings: No erythema or rash.  Neurological:     General: No focal deficit present.     Mental Status: She is alert and oriented to person, place, and time.     Cranial Nerves: No cranial nerve deficit.     Coordination: Coordination normal.     Gait: Gait normal.     Deep Tendon Reflexes:     Reflex Scores:      Bicep reflexes are 2+ on the right side and 2+ on the left side.      Patellar reflexes are 2+ on the right side and 2+ on the left side. Psychiatric:     Comments: Well groomed, good eye contact.   ASSESSMENT AND PLAN:  Ms. BRINKLEE CISSE was here today annual physical  examination.  Orders Placed This Encounter  Procedures   Basic metabolic panel   Hemoglobin A1c   Lipid panel   CBC   Ambulatory referral to Gastroenterology   Lab Results  Component Value Date   WBC 9.8 11/12/2021   HGB 12.7 11/12/2021   HCT 38.7 11/12/2021   MCV 90.7 11/12/2021   PLT 387.0 11/12/2021   Lab Results  Component Value Date   CREATININE 0.95 11/12/2021   BUN 12 11/12/2021   NA 140 11/12/2021   K 4.1 11/12/2021   CL 106 11/12/2021   CO2 25 11/12/2021   Lab Results  Component Value Date   CHOL 243 (H) 11/12/2021   HDL 52.90 11/12/2021   LDLCALC 156 (H) 11/12/2021   LDLDIRECT 141.0 08/12/2020   TRIG 171.0 (H) 11/12/2021   CHOLHDL 5 11/12/2021   Lab Results  Component Value Date   HGBA1C 5.7 11/12/2021   Routine general medical examination at a health care facility We discussed the importance of regular physical activity and healthy diet for prevention of chronic illness and/or complications. Preventive guidelines reviewed. Vaccination up to date. Continue following with her gyn for her female preventive care. Next CPE in a year. The 10-year ASCVD risk score (Arnett DK, et al., 2019) is: 1.8%   Values used to calculate the score:     Age: 23 years     Sex: Female     Is Non-Hispanic African American: Yes     Diabetic: No     Tobacco smoker: No     Systolic Blood Pressure: 169 mmHg     Is BP treated: No     HDL Cholesterol: 52.9 mg/dL     Total Cholesterol: 243 mg/dL  Screening for endocrine, metabolic and immunity disorder -     Hemoglobin A1c -     Basic metabolic panel  Colon cancer screening -  Ambulatory referral to Gastroenterology  GAD (generalized anxiety disorder) Stable. Continue Lexapro 10 mg 1/2 tab daily. As far as symptoms are stable, she can continue annual follow ups.  -     escitalopram (LEXAPRO) 10 MG tablet; Take 0.5 tablets (5 mg total) by mouth daily.  Iron deficiency anemia, unspecified iron deficiency anemia  type Mild and caused by DUB. Further recommendations according to CBC results. Following with gyn.  Hyperlipidemia Continue low fat diet. Further recommendations will be given according to 10 years CVD risk score and lipid panel numbers.  Return in 1 year (on 11/13/2022) for CPE.  Trenten Watchman G. Martin[place], MD  [facility]. [place] office.

## 2021-11-12 ENCOUNTER — Ambulatory Visit: Payer: 59 | Admitting: Family Medicine

## 2021-11-12 ENCOUNTER — Encounter: Payer: Self-pay | Admitting: Family Medicine

## 2021-11-12 VITALS — BP 136/88 | HR 66 | Resp 18 | Ht 64.5 in | Wt 185.5 lb

## 2021-11-12 DIAGNOSIS — Z1329 Encounter for screening for other suspected endocrine disorder: Secondary | ICD-10-CM | POA: Diagnosis not present

## 2021-11-12 DIAGNOSIS — Z1211 Encounter for screening for malignant neoplasm of colon: Secondary | ICD-10-CM | POA: Diagnosis not present

## 2021-11-12 DIAGNOSIS — Z130 Encounter for screening for diseases of the blood and blood-forming organs and certain disorders involving the immune mechanism: Secondary | ICD-10-CM | POA: Diagnosis not present

## 2021-11-12 DIAGNOSIS — Z0000 Encounter for general adult medical examination without abnormal findings: Secondary | ICD-10-CM | POA: Diagnosis not present

## 2021-11-12 DIAGNOSIS — D509 Iron deficiency anemia, unspecified: Secondary | ICD-10-CM

## 2021-11-12 DIAGNOSIS — F411 Generalized anxiety disorder: Secondary | ICD-10-CM | POA: Diagnosis not present

## 2021-11-12 DIAGNOSIS — E785 Hyperlipidemia, unspecified: Secondary | ICD-10-CM | POA: Diagnosis not present

## 2021-11-12 DIAGNOSIS — Z13228 Encounter for screening for other metabolic disorders: Secondary | ICD-10-CM

## 2021-11-12 LAB — LIPID PANEL
Cholesterol: 243 mg/dL — ABNORMAL HIGH (ref 0–200)
HDL: 52.9 mg/dL (ref >39.00)
LDL Cholesterol: 156 mg/dL — ABNORMAL HIGH (ref 0–99)
NonHDL: 190.56
Total CHOL/HDL Ratio: 5
Triglycerides: 171 mg/dL — ABNORMAL HIGH (ref 0.0–149.0)
VLDL: 34.2 mg/dL (ref 0.0–40.0)

## 2021-11-12 LAB — CBC
HCT: 38.7 % (ref 36.0–46.0)
Hemoglobin: 12.7 g/dL (ref 12.0–15.0)
MCHC: 32.8 g/dL (ref 30.0–36.0)
MCV: 90.7 fl (ref 78.0–100.0)
Platelets: 387 10*3/uL (ref 150.0–400.0)
RBC: 4.27 Mil/uL (ref 3.87–5.11)
RDW: 13.6 % (ref 11.5–15.5)
WBC: 9.8 10*3/uL (ref 4.0–10.5)

## 2021-11-12 LAB — BASIC METABOLIC PANEL
BUN: 12 mg/dL (ref 6–23)
CO2: 25 mEq/L (ref 19–32)
Calcium: 9.4 mg/dL (ref 8.4–10.5)
Chloride: 106 mEq/L (ref 96–112)
Creatinine, Ser: 0.95 mg/dL (ref 0.40–1.20)
GFR: 72.56 mL/min (ref >60.00)
Glucose, Bld: 95 mg/dL (ref 70–99)
Potassium: 4.1 mEq/L (ref 3.5–5.1)
Sodium: 140 mEq/L (ref 135–145)

## 2021-11-12 MED ORDER — ESCITALOPRAM OXALATE 10 MG PO TABS
5.0000 mg | ORAL_TABLET | Freq: Every day | ORAL | 3 refills | Status: DC
  Filled 2021-12-21: qty 45, 90d supply, fill #0

## 2021-11-12 NOTE — Patient Instructions (Addendum)
A few things to remember from today's visit:  Routine general medical examination at a health care facility  Hyperlipidemia, unspecified hyperlipidemia type - Plan: Lipid panel  Screening for endocrine, metabolic and immunity disorder - Plan: Basic metabolic panel, Hemoglobin A1c  Colon cancer screening - Plan: Ambulatory referral to Gastroenterology  GAD (generalized anxiety disorder) - Plan: escitalopram (LEXAPRO) 10 MG tablet  If you need refills for medications you take chronically, please call your pharmacy. Do not use My Chart to request refills or for acute issues that need immediate attention. If you send a my chart message, it may take a few days to be addressed, specially if I am not in the office.  Please be sure medication list is accurate. Continue Lexapro 10 mg 1/2 tab daily. As far as symptoms are stable, annual follow up is appropriate. Continue following with gynecologist.  If a new problem present, please set up appointment sooner than planned today.  Health Maintenance, Female Adopting a healthy lifestyle and getting preventive care are important in promoting health and wellness. Ask your health care provider about: The right schedule for you to have regular tests and exams. Things you can do on your own to prevent diseases and keep yourself healthy. What should I know about diet, weight, and exercise? Eat a healthy diet  Eat a diet that includes plenty of vegetables, fruits, low-fat dairy products, and lean protein. Do not eat a lot of foods that are high in solid fats, added sugars, or sodium. Maintain a healthy weight Body mass index (BMI) is used to identify weight problems. It estimates body fat based on height and weight. Your health care provider can help determine your BMI and help you achieve or maintain a healthy weight. Get regular exercise Get regular exercise. This is one of the most important things you can do for your health. Most adults  should: Exercise for at least 150 minutes each week. The exercise should increase your heart rate and make you sweat (moderate-intensity exercise). Do strengthening exercises at least twice a week. This is in addition to the moderate-intensity exercise. Spend less time sitting. Even light physical activity can be beneficial. Watch cholesterol and blood lipids Have your blood tested for lipids and cholesterol at 45 years of age, then have this test every 5 years. Have your cholesterol levels checked more often if: Your lipid or cholesterol levels are high. You are older than 45 years of age. You are at high risk for heart disease. What should I know about cancer screening? Depending on your health history and family history, you may need to have cancer screening at various ages. This may include screening for: Breast cancer. Cervical cancer. Colorectal cancer. Skin cancer. Lung cancer. What should I know about heart disease, diabetes, and high blood pressure? Blood pressure and heart disease High blood pressure causes heart disease and increases the risk of stroke. This is more likely to develop in people who have high blood pressure readings or are overweight. Have your blood pressure checked: Every 3-5 years if you are 27-33 years of age. Every year if you are 46 years old or older. Diabetes Have regular diabetes screenings. This checks your fasting blood sugar level. Have the screening done: Once every three years after age 52 if you are at a normal weight and have a low risk for diabetes. More often and at a younger age if you are overweight or have a high risk for diabetes. What should I know about preventing infection?  Hepatitis B If you have a higher risk for hepatitis B, you should be screened for this virus. Talk with your health care provider to find out if you are at risk for hepatitis B infection. Hepatitis C Testing is recommended for: Everyone born from [birth year] through  1965. Anyone with known risk factors for hepatitis C. Sexually transmitted infections (STIs) Get screened for STIs, including gonorrhea and chlamydia, if: You are sexually active and are younger than 45 years of age. You are older than 45 years of age and your health care provider tells you that you are at risk for this type of infection. Your sexual activity has changed since you were last screened, and you are at increased risk for chlamydia or gonorrhea. Ask your health care provider if you are at risk. Ask your health care provider about whether you are at high risk for HIV. Your health care provider may recommend a prescription medicine to help prevent HIV infection. If you choose to take medicine to prevent HIV, you should first get tested for HIV. You should then be tested every 3 months for as long as you are taking the medicine. Pregnancy If you are about to stop having your period (premenopausal) and you may become pregnant, seek counseling before you get pregnant. Take 400 to 800 micrograms (mcg) of folic acid every day if you become pregnant. Ask for birth control (contraception) if you want to prevent pregnancy. Osteoporosis and menopause Osteoporosis is a disease in which the bones lose minerals and strength with aging. This can result in bone fractures. If you are 1 years old or older, or if you are at risk for osteoporosis and fractures, ask your health care provider if you should: Be screened for bone loss. Take a calcium or vitamin D supplement to lower your risk of fractures. Be given hormone replacement therapy (HRT) to treat symptoms of menopause. Follow these instructions at home: Alcohol use Do not drink alcohol if: Your health care provider tells you not to drink. You are pregnant, may be pregnant, or are planning to become pregnant. If you drink alcohol: Limit how much you have to: 0-1 drink a day. Know how much alcohol is in your drink. In the [place] one drink  equals one 12 oz bottle of beer (355 mL), one 5 oz glass of wine (148 mL), or one 1 oz glass of hard liquor (44 mL). Lifestyle Do not use any products that contain nicotine or tobacco. These products include cigarettes, chewing tobacco, and vaping devices, such as e-cigarettes. If you need help quitting, ask your health care provider. Do not use street drugs. Do not share needles. Ask your health care provider for help if you need support or information about quitting drugs. General instructions Schedule regular health, dental, and eye exams. Stay current with your vaccines. Tell your health care provider if: You often feel depressed. You have ever been abused or do not feel safe at home. Summary Adopting a healthy lifestyle and getting preventive care are important in promoting health and wellness. Follow your health care provider's instructions about healthy diet, exercising, and getting tested or screened for diseases. Follow your health care provider's instructions on monitoring your cholesterol and blood pressure. This information is not intended to replace advice given to you by your health care provider. Make sure you discuss any questions you have with your health care provider. Document Revised: 06/29/2020 Document Reviewed: 06/29/2020 Elsevier Patient Education  [place].

## 2021-11-12 NOTE — Assessment & Plan Note (Signed)
Continue low fat diet. Further recommendations will be given according to 10 years CVD risk score and lipid panel numbers.

## 2021-11-15 ENCOUNTER — Ambulatory Visit: Payer: 59 | Attending: Family Medicine | Admitting: Pharmacist

## 2021-11-15 ENCOUNTER — Telehealth: Payer: Self-pay | Admitting: Pharmacist

## 2021-11-15 ENCOUNTER — Other Ambulatory Visit (HOSPITAL_COMMUNITY): Payer: Self-pay

## 2021-11-15 DIAGNOSIS — Z7189 Other specified counseling: Secondary | ICD-10-CM

## 2021-11-15 MED ORDER — DUPIXENT 300 MG/2ML ~~LOC~~ SOSY
300.0000 mg | PREFILLED_SYRINGE | SUBCUTANEOUS | 6 refills | Status: DC
  Filled 2021-11-15: qty 4, 28d supply, fill #0
  Filled 2021-12-03: qty 4, 28d supply, fill #1
  Filled 2022-01-04: qty 4, 28d supply, fill #2
  Filled 2022-02-28 – 2022-03-07 (×2): qty 4, 28d supply, fill #3
  Filled 2022-04-05: qty 4, 28d supply, fill #4
  Filled 2022-05-04: qty 4, 28d supply, fill #5
  Filled 2022-05-31 (×2): qty 4, 28d supply, fill #6

## 2021-11-15 NOTE — Telephone Encounter (Signed)
Called patient to schedule an appointment for the [employer] [facility]. I was unable to reach the patient so I left a HIPAA-compliant message requesting that the patient return my call.   Luke Van Ausdall, PharmD, BCACP, CPP Clinical Pharmacist [facility] [phone]  

## 2021-11-15 NOTE — Progress Notes (Signed)
   S: Patient presents for review of their specialty medication therapy.  Patient is currently taking Dupixent for atopic dermatitis. Patient is managed by Junie Panning Gray/Dr. Haverstock for this.   Adherence: denies any missed doses.  Efficacy: reports that it continues to work well.  Dosing: 300 mg every other week.  Dose adjustments: Renal: no dose adjustments (has not been studied) Hepatic: no dose adjustments (has not been studied)  Patient denies any adverse effects.  O:     Lab Results  Component Value Date   WBC 9.8 11/12/2021   HGB 12.7 11/12/2021   HCT 38.7 11/12/2021   MCV 90.7 11/12/2021   PLT 387.0 11/12/2021      Chemistry      Component Value Date/Time   NA 138 08/12/2020 0730   K 4.7 08/12/2020 0730   CL 105 08/12/2020 0730   CO2 25 08/12/2020 0730   BUN 17 08/12/2020 0730   CREATININE 0.94 08/12/2020 0730      Component Value Date/Time   CALCIUM 9.4 08/12/2020 0730   ALKPHOS 55 10/28/2014 1318   AST 23 10/28/2014 1318   ALT 19 10/28/2014 1318   BILITOT 0.3 10/28/2014 1318       A/P: 1. Medication review: Patient currently on [place] for atopic dermatitis. Patient denies any adverse effects and has no questions about Dupixent at this time. Has been stable on medication for the last few years. Patient encouraged to reach out with any questions. Will follow up in one year.  Benard Halsted, PharmD, [facility] [place], [place] [phone]

## 2021-11-16 ENCOUNTER — Other Ambulatory Visit (HOSPITAL_COMMUNITY): Payer: Self-pay

## 2021-11-16 LAB — HEMOGLOBIN A1C: Hgb A1c MFr Bld: 5.7 % (ref 4.6–6.5)

## 2021-12-02 ENCOUNTER — Other Ambulatory Visit (HOSPITAL_COMMUNITY): Payer: Self-pay

## 2021-12-03 ENCOUNTER — Other Ambulatory Visit (HOSPITAL_COMMUNITY): Payer: Self-pay

## 2021-12-07 ENCOUNTER — Encounter: Payer: Self-pay | Admitting: Gastroenterology

## 2021-12-13 ENCOUNTER — Other Ambulatory Visit (HOSPITAL_COMMUNITY): Payer: Self-pay

## 2021-12-17 ENCOUNTER — Other Ambulatory Visit (HOSPITAL_COMMUNITY): Payer: Self-pay

## 2021-12-20 ENCOUNTER — Other Ambulatory Visit (HOSPITAL_COMMUNITY): Payer: Self-pay

## 2021-12-20 ENCOUNTER — Telehealth: Payer: 59 | Admitting: Physician Assistant

## 2021-12-20 DIAGNOSIS — R3989 Other symptoms and signs involving the genitourinary system: Secondary | ICD-10-CM

## 2021-12-20 MED ORDER — CEPHALEXIN 500 MG PO CAPS
500.0000 mg | ORAL_CAPSULE | Freq: Two times a day (BID) | ORAL | 0 refills | Status: DC
  Filled 2021-12-20: qty 14, 7d supply, fill #0

## 2021-12-20 NOTE — Progress Notes (Signed)

## 2021-12-21 ENCOUNTER — Other Ambulatory Visit (HOSPITAL_COMMUNITY): Payer: Self-pay

## 2021-12-22 ENCOUNTER — Other Ambulatory Visit (HOSPITAL_COMMUNITY): Payer: Self-pay

## 2021-12-24 ENCOUNTER — Other Ambulatory Visit (HOSPITAL_COMMUNITY): Payer: Self-pay

## 2021-12-30 ENCOUNTER — Other Ambulatory Visit (HOSPITAL_COMMUNITY): Payer: Self-pay

## 2022-01-01 ENCOUNTER — Other Ambulatory Visit (HOSPITAL_COMMUNITY): Payer: Self-pay

## 2022-01-04 ENCOUNTER — Other Ambulatory Visit (HOSPITAL_COMMUNITY): Payer: Self-pay

## 2022-01-10 ENCOUNTER — Ambulatory Visit (AMBULATORY_SURGERY_CENTER): Payer: Self-pay

## 2022-01-10 VITALS — Ht 64.5 in | Wt 188.0 lb

## 2022-01-10 DIAGNOSIS — Z1211 Encounter for screening for malignant neoplasm of colon: Secondary | ICD-10-CM

## 2022-01-10 DIAGNOSIS — Z87448 Personal history of other diseases of urinary system: Secondary | ICD-10-CM | POA: Insufficient documentation

## 2022-01-10 MED ORDER — ONDANSETRON HCL 4 MG PO TABS: 4.0000 mg | ORAL_TABLET | ORAL | 0 refills | Status: AC

## 2022-01-10 MED ORDER — NA SULFATE-K SULFATE-MG SULF 17.5-3.13-1.6 GM/177ML PO SOLN: 1.0000 | Freq: Once | ORAL | 0 refills | Status: AC

## 2022-01-10 MED ORDER — ONDANSETRON HCL 4 MG PO TABS: 4.0000 mg | ORAL_TABLET | Freq: Three times a day (TID) | ORAL | 1 refills | Status: DC | PRN

## 2022-01-10 NOTE — Addendum Note (Signed)
Addended by: Etheleen Nicks on: 01/10/2022 06:07 PM   Modules accepted: Orders

## 2022-01-10 NOTE — Progress Notes (Signed)
No egg or soy allergy known to patient   No past sedation or surgeries  No FH of Malignant Hyperthermia  Pt is not on diet pills  Pt is not on  home 02   Pt is not on blood thinners   Pt denies issues with constipation   No A fib or A flutter  Have any cardiac testing pending--No   [facility] for suprep   Patient's chart reviewed by Osvaldo Angst CNRA prior to previsit and patient appropriate for the [facility].  Previsit completed and red dot placed by patient's name on their procedure day (on provider's schedule).

## 2022-01-17 ENCOUNTER — Other Ambulatory Visit (HOSPITAL_COMMUNITY): Payer: Self-pay

## 2022-02-02 ENCOUNTER — Encounter: Payer: Self-pay | Admitting: Gastroenterology

## 2022-02-04 ENCOUNTER — Other Ambulatory Visit (HOSPITAL_COMMUNITY): Payer: Self-pay

## 2022-02-08 ENCOUNTER — Ambulatory Visit (AMBULATORY_SURGERY_CENTER): Payer: 59 | Admitting: Gastroenterology

## 2022-02-08 ENCOUNTER — Encounter: Payer: Self-pay | Admitting: Gastroenterology

## 2022-02-08 VITALS — BP 123/84 | HR 70 | Temp 98.0°F | Resp 14 | Ht 64.0 in | Wt 188.0 lb

## 2022-02-08 DIAGNOSIS — K635 Polyp of colon: Secondary | ICD-10-CM | POA: Diagnosis not present

## 2022-02-08 DIAGNOSIS — Z1211 Encounter for screening for malignant neoplasm of colon: Secondary | ICD-10-CM

## 2022-02-08 DIAGNOSIS — D125 Benign neoplasm of sigmoid colon: Secondary | ICD-10-CM

## 2022-02-08 MED ORDER — SODIUM CHLORIDE 0.9 % IV SOLN: 500.0000 mL | Freq: Once | INTRAVENOUS | Status: DC

## 2022-02-08 NOTE — Progress Notes (Signed)
Pt's states no medical or surgical changes since previsit or office visit. 

## 2022-02-08 NOTE — Progress Notes (Signed)
[facility] History and Physical   Primary Care Physician:  Martinique, Betty G, MD   Reason for Procedure:   Colon cancer screening  Plan:    colonoscopy     HPI: Natalie Gilbert is a 46 y.o. female  here for colonoscopy screening. First time exam.  . Patient denies any bowel symptoms at this time. No family history of colon cancer known. Otherwise feels well without any cardiopulmonary symptoms.   I have discussed risks / benefits of anesthesia and endoscopic procedure with [name] and they wish to proceed with the exams as outlined today.    Past Medical History:  Diagnosis Date   Allergy    seasonal, animal dander   Anxiety    Depression    Eczema    Heart murmur     History reviewed. No pertinent surgical history.  Prior to Admission medications   Medication Sig Start Date End Date Taking? Authorizing Provider  cetirizine (ZYRTEC) 10 MG tablet TAKE 1 TABLET BY MOUTH EVERY DAY 03/21/19  Yes Ambs, Kathrine Cords, FNP  dupilumab (DUPIXENT) 300 MG/2ML prefilled syringe Use 1 injection under the skin every other week 11/15/21  Yes Jegede, Olugbemiga E, MD  escitalopram (LEXAPRO) 10 MG tablet Take 0.5 tablets (5 mg total) by mouth daily. 11/12/21  Yes Martinique, Betty G, MD  MICROGESTIN FE 1/20 1-20 MG-MCG tablet  09/19/12  Yes [provider]  ondansetron (ZOFRAN) 4 MG tablet Take 1 tablet (4 mg total) by mouth as directed for 2 doses. Take one Zofran 4 mg tablet 30-60 minutes before each prep dose 01/10/22  Yes Laquisha Northcraft, Carlota Raspberry, MD  augmented betamethasone dipropionate (DIPROLENE-AF) 0.05 % ointment Apply on the skin once nightly for up to 30 days as directed. Patient not taking: Reported on 01/10/2022 04/09/21     cephALEXin (KEFLEX) 500 MG capsule Take 1 capsule (500 mg total) by mouth 2 (two) times daily. 12/20/21   Mar Daring, PA-C  clobetasol ointment (TEMOVATE) 0.05 % Apply to affected areas daily at night 04/12/21     desonide (DESOWEN) 0.05 %  cream desonide 0.05 % topical cream  APPLY SPARINGLY AND RUB GENTLY INTO THE AFFECTED AREA(S) BY TOPICAL ROUTE 2 TIMES PER DAY Patient not taking: Reported on 01/10/2022    [provider]  fluocinonide ointment (LIDEX) 0.05 % fluocinonide 0.05 % topical ointment  APPLY TO AFFECTED AREA OF HANDS TWICE A DAY FOR 14 DAYS    [provider]  hydrocortisone 2.5 % cream Apply topically 2 times daily as directed for 14 days Patient not taking: Reported on 01/10/2022 03/01/21     pimecrolimus (ELIDEL) 1 % cream Apply topically 2 times a day Patient not taking: Reported on 01/10/2022 04/09/21     propranolol (INDERAL) 20 MG tablet Take 1 tablet (20 mg total) by mouth 2 (two) times daily as needed. 03/30/21   Martinique, Betty G, MD  tacrolimus (PROTOPIC) 0.1 % ointment Apply to affected area 2 times a day Patient not taking: Reported on 01/10/2022 04/12/21     tacrolimus (PROTOPIC) 0.1 % ointment Apply topically twice a day 10/04/21       Current Outpatient Medications  Medication Sig Dispense Refill   cetirizine (ZYRTEC) 10 MG tablet TAKE 1 TABLET BY MOUTH EVERY DAY 30 tablet 0   dupilumab (DUPIXENT) 300 MG/2ML prefilled syringe Use 1 injection under the skin every other week 4 mL 6   escitalopram (LEXAPRO) 10 MG tablet Take 0.5 tablets (5 mg total) by  mouth daily. 45 tablet 3   MICROGESTIN FE 1/20 1-20 MG-MCG tablet      ondansetron (ZOFRAN) 4 MG tablet Take 1 tablet (4 mg total) by mouth as directed for 2 doses. Take one Zofran 4 mg tablet 30-60 minutes before each prep dose 2 tablet 0   augmented betamethasone dipropionate (DIPROLENE-AF) 0.05 % ointment Apply on the skin once nightly for up to 30 days as directed. (Patient not taking: Reported on 01/10/2022) 50 g 0   cephALEXin (KEFLEX) 500 MG capsule Take 1 capsule (500 mg total) by mouth 2 (two) times daily. 14 capsule 0   clobetasol ointment (TEMOVATE) 0.05 % Apply to affected areas daily at night 60 g 0   desonide (DESOWEN) 0.05 %  cream desonide 0.05 % topical cream  APPLY SPARINGLY AND RUB GENTLY INTO THE AFFECTED AREA(S) BY TOPICAL ROUTE 2 TIMES PER DAY (Patient not taking: Reported on 01/10/2022)     fluocinonide ointment (LIDEX) 0.05 % fluocinonide 0.05 % topical ointment  APPLY TO AFFECTED AREA OF HANDS TWICE A DAY FOR 14 DAYS     hydrocortisone 2.5 % cream Apply topically 2 times daily as directed for 14 days (Patient not taking: Reported on 01/10/2022) 30 g 1   pimecrolimus (ELIDEL) 1 % cream Apply topically 2 times a day (Patient not taking: Reported on 01/10/2022) 30 g 0   propranolol (INDERAL) 20 MG tablet Take 1 tablet (20 mg total) by mouth 2 (two) times daily as needed. 30 tablet 1   tacrolimus (PROTOPIC) 0.1 % ointment Apply to affected area 2 times a day (Patient not taking: Reported on 01/10/2022) 30 g 0   tacrolimus (PROTOPIC) 0.1 % ointment Apply topically twice a day 30 g 0   Current Facility-Administered Medications  Medication Dose Route Frequency Provider Last Rate Last Admin   0.9 %  sodium chloride infusion  500 mL Intravenous Once Sonali Wivell, Carlota Raspberry, MD        Allergies as of 02/08/2022   (No Known Allergies)    Family History  Problem Relation Age of Onset   Hypertension Mother    Heart murmur Father    Hypertension Brother    Diabetes Maternal Grandmother    Breast cancer Paternal Grandmother    Colon cancer Neg Hx    Colon polyps Neg Hx    Esophageal cancer Neg Hx    Stomach cancer Neg Hx    Rectal cancer Neg Hx     Social History   Socioeconomic History   Marital status: Single    Spouse name: Not on file   Number of children: Not on file   Years of education: Not on file   Highest education level: Not on file  Occupational History   Not on file  Tobacco Use   Smoking status: Never   Smokeless tobacco: Never  Vaping Use   Vaping Use: Never used  Substance and Sexual Activity   Alcohol use: Never   Drug use: Never   Sexual activity: Yes    Birth  control/protection: Spermicide, Pill  Other Topics Concern   Not on file  Social History Narrative   Not on file   Social Determinants of Health   Financial Resource Strain: Not on file  Food Insecurity: Not on file  Transportation Needs: Not on file  Physical Activity: Not on file  Stress: Not on file  Social Connections: Not on file  Intimate Partner Violence: Not on file    Review of Systems: All other review  of systems negative except as mentioned in the HPI.  Physical Exam: Vital signs BP (!) 151/84   Pulse 84   Temp 98 F (36.7 C)   Ht '5\' 4"'$  (1.626 m)   Wt 188 lb (85.3 kg)   SpO2 96%   BMI 32.27 kg/m   General:   Alert,  Well-developed, pleasant and cooperative in NAD Lungs:  Clear throughout to auscultation.   Heart:  Regular rate and rhythm Abdomen:  Soft, nontender and nondistended.   Neuro/Psych:  Alert and cooperative. Normal mood and affect. A and O x 3  Jolly Mango, MD [facility]

## 2022-02-08 NOTE — Progress Notes (Signed)
Sedate, gd SR, tolerated procedure well, VSS, report to RN 

## 2022-02-08 NOTE — Op Note (Signed)
[facility] Patient Name: Natalie Gilbert Procedure Date: 02/08/2022 2:59 PM MRN: [id number] Endoscopist: Remo Lipps P. Havery Moros , MD, [id number] Age: 46 Referring MD:  Date of Birth: 05-Dec-1975 Gender: Female Account #: [id number] Procedure:                Colonoscopy Indications:              Screening for colorectal malignant neoplasm, This                            is the patient's first colonoscopy Medicines:                Monitored Anesthesia Care Procedure:                Pre-Anesthesia Assessment:                           - Prior to the procedure, a History and Physical                            was performed, and patient medications and                            allergies were reviewed. The patient's tolerance of                            previous anesthesia was also reviewed. The risks                            and benefits of the procedure and the sedation                            options and risks were discussed with the patient.                            All questions were answered, and informed consent                            was obtained. Prior Anticoagulants: The patient has                            taken no anticoagulant or antiplatelet agents. ASA                            Grade Assessment: II - A patient with mild systemic                            disease. After reviewing the risks and benefits,                            the patient was deemed in satisfactory condition to                            undergo the procedure.  After obtaining informed consent, the colonoscope                            was passed under direct vision. Throughout the                            procedure, the patient's blood pressure, pulse, and                            oxygen saturations were monitored continuously. The                            PCF-HQ190L Colonoscope was introduced through the                            anus and  advanced to the the cecum, identified by                            appendiceal orifice and ileocecal valve. The                            colonoscopy was performed without difficulty. The                            patient tolerated the procedure well. The quality                            of the bowel preparation was good. The ileocecal                            valve, appendiceal orifice, and rectum were                            photographed. Scope In: 3:05:07 PM Scope Out: 3:24:10 PM Scope Withdrawal Time: 0 hours 14 minutes 39 seconds  Total Procedure Duration: 0 hours 19 minutes 3 seconds  Findings:                 The perianal and digital rectal examinations were                            normal.                           A 4 mm polyp was found in the sigmoid colon. The                            polyp was sessile. The polyp was removed with a                            cold snare. Resection and retrieval were complete.                           Internal hemorrhoids were found during  retroflexion. The hemorrhoids were small.                           The exam was otherwise without abnormality. Complications:            No immediate complications. Estimated blood loss:                            Minimal. Estimated Blood Loss:     Estimated blood loss was minimal. Impression:               - One 4 mm polyp in the sigmoid colon, removed with                            a cold snare. Resected and retrieved.                           - Internal hemorrhoids.                           - The examination was otherwise normal. Recommendation:           - Patient has a contact number available for                            emergencies. The signs and symptoms of potential                            delayed complications were discussed with the                            patient. Return to normal activities tomorrow.                            Written  discharge instructions were provided to the                            patient.                           - Resume previous diet.                           - Continue present medications.                           - Await pathology results. Remo Lipps P. Havery Moros, MD 02/08/2022 3:28:02 PM This report has been signed electronically.

## 2022-02-08 NOTE — Progress Notes (Signed)
Called to room to assist during endoscopic procedure.  Patient ID and intended procedure confirmed with present staff. Received instructions for my participation in the procedure from the performing physician.  

## 2022-02-08 NOTE — Patient Instructions (Addendum)
    Handouts on polyps  & hemorroids given to you today   Await pathology results on polyp removed     YOU HAD AN ENDOSCOPIC PROCEDURE TODAY AT [facility]:   Refer to the procedure report that was given to you for any specific questions about what was found during the examination.  If the procedure report does not answer your questions, please call your gastroenterologist to clarify.  If you requested that your care partner not be given the details of your procedure findings, then the procedure report has been included in a sealed envelope for you to review at your convenience later.  YOU SHOULD EXPECT: Some feelings of bloating in the abdomen. Passage of more gas than usual.  Walking can help get rid of the air that was put into your GI tract during the procedure and reduce the bloating. If you had a lower endoscopy (such as a colonoscopy or flexible sigmoidoscopy) you may notice spotting of blood in your stool or on the toilet paper. If you underwent a bowel prep for your procedure, you may not have a normal bowel movement for a few days.  Please Note:  You might notice some irritation and congestion in your nose or some drainage.  This is from the oxygen used during your procedure.  There is no need for concern and it should clear up in a day or so.  SYMPTOMS TO REPORT IMMEDIATELY:  Following lower endoscopy (colonoscopy or flexible sigmoidoscopy):  Excessive amounts of blood in the stool  Significant tenderness or worsening of abdominal pains  Swelling of the abdomen that is new, acute  Fever of 100F or higher   For urgent or emergent issues, a gastroenterologist can be reached at any hour by calling [phone]. Do not use MyChart messaging for urgent concerns.    DIET:  We do recommend a small meal at first, but then you may proceed to your regular diet.  Drink plenty of fluids but you should avoid alcoholic beverages for 24 hours.  ACTIVITY:  You should plan  to take it easy for the rest of today and you should NOT DRIVE or use heavy machinery until tomorrow (because of the sedation medicines used during the test).    FOLLOW UP: Our staff will call the number listed on your records the next business day following your procedure.  We will call around 7:15- 8:00 am to check on you and address any questions or concerns that you may have regarding the information given to you following your procedure. If we do not reach you, we will leave a message.     If any biopsies were taken you will be contacted by phone or by letter within the next 1-3 weeks.  Please call us at [phone] if you have not heard about the biopsies in 3 weeks.    SIGNATURES/CONFIDENTIALITY: You and/or your care partner have signed paperwork which will be entered into your electronic medical record.  These signatures attest to the fact that that the information above on your After Visit Summary has been reviewed and is understood.  Full responsibility of the confidentiality of this discharge information lies with you and/or your care-partner.

## 2022-02-09 ENCOUNTER — Telehealth: Payer: Self-pay | Admitting: *Deleted

## 2022-02-09 NOTE — Telephone Encounter (Signed)
  Follow up Call-     02/08/2022    2:13 PM  Call back number  Post procedure Call Back phone  # [phone]  Permission to leave phone message Yes     Patient questions:  Do you have a fever, pain , or abdominal swelling? No. Pain Score  0 *  Have you tolerated food without any problems? Yes.    Have you been able to return to your normal activities? Yes.    Do you have any questions about your discharge instructions: Diet   No. Medications  No. Follow up visit  No.  Do you have questions or concerns about your Care? No.  Actions: * If pain score is 4 or above: No action needed, pain <4.

## 2022-02-28 ENCOUNTER — Other Ambulatory Visit (HOSPITAL_COMMUNITY): Payer: Self-pay

## 2022-03-07 ENCOUNTER — Other Ambulatory Visit (HOSPITAL_COMMUNITY): Payer: Self-pay

## 2022-03-07 ENCOUNTER — Telehealth: Payer: 59 | Admitting: Family

## 2022-03-07 DIAGNOSIS — B9689 Other specified bacterial agents as the cause of diseases classified elsewhere: Secondary | ICD-10-CM | POA: Diagnosis not present

## 2022-03-07 DIAGNOSIS — J208 Acute bronchitis due to other specified organisms: Secondary | ICD-10-CM

## 2022-03-07 MED ORDER — AZITHROMYCIN 250 MG PO TABS
ORAL_TABLET | ORAL | 0 refills | Status: DC
  Filled 2022-03-07: qty 6, 5d supply, fill #0

## 2022-03-07 MED ORDER — ALBUTEROL SULFATE HFA 108 (90 BASE) MCG/ACT IN AERS
2.0000 | INHALATION_SPRAY | Freq: Four times a day (QID) | RESPIRATORY_TRACT | 0 refills | Status: AC | PRN
  Filled 2022-03-07: qty 6.7, 25d supply, fill #0

## 2022-03-07 MED ORDER — PREDNISONE 10 MG (21) PO TBPK
ORAL_TABLET | ORAL | 0 refills | Status: DC
  Filled 2022-03-07: qty 21, 6d supply, fill #0

## 2022-03-07 MED ORDER — BENZONATATE 100 MG PO CAPS
100.0000 mg | ORAL_CAPSULE | Freq: Three times a day (TID) | ORAL | 0 refills | Status: DC | PRN
  Filled 2022-03-07: qty 20, 7d supply, fill #0

## 2022-03-07 NOTE — Progress Notes (Signed)
Virtual Visit Consent   Natalie Gilbert, you are scheduled for a virtual visit with a [facility] provider today. Just as with appointments in the office, your consent must be obtained to participate. Your consent will be active for this visit and any virtual visit you may have with one of our providers in the next 365 days. If you have a MyChart account, a copy of this consent can be sent to you electronically.  As this is a virtual visit, video technology does not allow for your provider to perform a traditional examination. This may limit your provider's ability to fully assess your condition. If your provider identifies any concerns that need to be evaluated in person or the need to arrange testing (such as labs, EKG, etc.), we will make arrangements to do so. Although advances in technology are sophisticated, we cannot ensure that it will always work on either your end or our end. If the connection with a video visit is poor, the visit may have to be switched to a telephone visit. With either a video or telephone visit, we are not always able to ensure that we have a secure connection.  By engaging in this virtual visit, you consent to the provision of healthcare and authorize for your insurance to be billed (if applicable) for the services provided during this visit. Depending on your insurance coverage, you may receive a charge related to this service.  I need to obtain your verbal consent now. Are you willing to proceed with your visit today? [name] has provided verbal consent on 03/07/2022 for a virtual visit (video or telephone). Natalie Dun, FNP  Date: 03/07/2022 5:38 PM  Virtual Visit via Video Note   I, Natalie Gilbert, connected with  KENDAHL Gilbert  ([id number], 05/07/1975) on 03/07/22 at  5:45 PM EST by a video-enabled telemedicine application and verified that I am speaking with the correct person using two identifiers.  Location: Patient: Virtual Visit Location Patient:  Other: work Provider: Ecologist: Home Office   I discussed the limitations of evaluation and management by telemedicine and the availability of in person appointments. The patient expressed understanding and agreed to proceed.    History of Present Illness: Natalie Gilbert is a 47 y.o. who identifies as a female who was assigned female at birth, and is being seen today for cough that started three weeks.  HPI: Cough This is a new problem. The current episode started 1 to 4 weeks ago. The problem has been waxing and waning. The problem occurs every few minutes. The cough is Productive of sputum. Associated symptoms include nasal congestion, postnasal drip, rhinorrhea, a sore throat, shortness of breath and wheezing. Pertinent negatives include no chills, ear congestion, ear pain, fever, headaches or myalgias. Associated symptoms comments: Chest pain when coughing . She has tried rest and OTC cough suppressant for the symptoms. The treatment provided mild relief.    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   Other allergic rhinitis 07/31/2017   Hyperlipidemia 05/14/2017   Obesity (BMI 30-39.9) 05/19/2016   Depressive disorder 09/19/2012    Allergies: No Known Allergies Medications:  Current Outpatient Medications:    albuterol (VENTOLIN HFA) 108 (90  Base) MCG/ACT inhaler, Inhale 2 puffs into the lungs every 6 (six) hours as needed for wheezing or shortness of breath., Disp: 8 g, Rfl: 0   azithromycin (ZITHROMAX) 250 MG tablet, Take 500 mg once, then 250 mg for four days, Disp: 6 tablet, Rfl: 0   benzonatate (TESSALON PERLES) 100 MG capsule, Take 1 capsule (100 mg  total) by mouth 3 (three) times daily as needed., Disp: 20 capsule, Rfl: 0   predniSONE (STERAPRED UNI-PAK 21 TAB) 10 MG (21) TBPK tablet, Use as directed, Disp: 21 tablet, Rfl: 0   cetirizine (ZYRTEC) 10 MG tablet, TAKE 1 TABLET BY MOUTH EVERY DAY, Disp: 30 tablet, Rfl: 0   clobetasol ointment (TEMOVATE) 0.05 %, Apply to affected areas daily at night, Disp: 60 g, Rfl: 0   desonide (DESOWEN) 0.05 % cream, desonide 0.05 % topical cream  APPLY SPARINGLY AND RUB GENTLY INTO THE AFFECTED AREA(S) BY TOPICAL ROUTE 2 TIMES PER DAY (Patient not taking: Reported on 01/10/2022), Disp: , Rfl:    dupilumab (DUPIXENT) 300 MG/2ML prefilled syringe, Use 1 injection under the skin every other week, Disp: 4 mL, Rfl: 6   hydrocortisone 2.5 % cream, Apply topically 2 times daily as directed for 14 days (Patient not taking: Reported on 01/10/2022), Disp: 30 g, Rfl: 1   MICROGESTIN FE 1/20 1-20 MG-MCG tablet, , Disp: , Rfl:    ondansetron (ZOFRAN) 4 MG tablet, Take 1 tablet (4 mg total) by mouth as directed for 2 doses. Take one Zofran 4 mg tablet 30-60 minutes before each prep dose, Disp: 2 tablet, Rfl: 0   pimecrolimus (ELIDEL) 1 % cream, Apply topically 2 times a day (Patient not taking: Reported on 01/10/2022), Disp: 30 g, Rfl: 0   tacrolimus (PROTOPIC) 0.1 % ointment, Apply topically twice a day, Disp: 30 g, Rfl: 0  Observations/Objective: Patient is well-developed, well-nourished in no acute distress.  Resting comfortably  at home.  Head is normocephalic, atraumatic.  No labored breathing.  Speech is clear and coherent with logical content.  Patient is alert and oriented at baseline.  Nasal congestion  Assessment and Plan: 1. Acute bacterial bronchitis - azithromycin (ZITHROMAX) 250 MG tablet; Take 500 mg once, then 250 mg for four days  Dispense: 6 tablet; Refill: 0 - predniSONE (STERAPRED UNI-PAK 21 TAB) 10 MG (21) TBPK tablet; Use as directed  Dispense: 21 tablet; Refill: 0 - albuterol (VENTOLIN HFA)  108 (90 Base) MCG/ACT inhaler; Inhale 2 puffs into the lungs every 6 (six) hours as needed for wheezing or shortness of breath.  Dispense: 8 g; Refill: 0 - benzonatate (TESSALON PERLES) 100 MG capsule; Take 1 capsule (100 mg total) by mouth 3 (three) times daily as needed.  Dispense: 20 capsule; Refill: 0  - Take meds as prescribed - Use a cool mist humidifier  -Use saline nose sprays frequently -Force fluids -For any cough or congestion  Use plain Mucinex- regular strength or max strength is fine -For fever or aces or pains- take tylenol or ibuprofen. -Throat lozenges if help -Follow up if symptoms worsen or do not improve   Follow Up Instructions: I discussed the assessment and treatment plan with the patient. The patient was provided an opportunity to ask questions and all were answered. The patient agreed with the plan and demonstrated an understanding of the instructions.  A copy of instructions were sent to the patient via MyChart unless otherwise noted below.     The patient was advised to call back or seek an in-person  evaluation if the symptoms worsen or if the condition fails to improve as anticipated.  Time:  I spent 8 minutes with the patient via telehealth technology discussing the above problems/concerns.    Natalie Dun, FNP

## 2022-03-08 ENCOUNTER — Other Ambulatory Visit (HOSPITAL_COMMUNITY): Payer: Self-pay

## 2022-03-11 ENCOUNTER — Other Ambulatory Visit (HOSPITAL_COMMUNITY): Payer: Self-pay

## 2022-03-22 ENCOUNTER — Telehealth: Payer: 59 | Admitting: Urgent Care

## 2022-03-22 ENCOUNTER — Other Ambulatory Visit (HOSPITAL_COMMUNITY): Payer: Self-pay

## 2022-03-22 DIAGNOSIS — L219 Seborrheic dermatitis, unspecified: Secondary | ICD-10-CM | POA: Diagnosis not present

## 2022-03-22 MED ORDER — KETOCONAZOLE 2 % EX SHAM
1.0000 | MEDICATED_SHAMPOO | CUTANEOUS | 0 refills | Status: AC
  Filled 2022-03-22: qty 120, 30d supply, fill #0

## 2022-03-22 NOTE — Patient Instructions (Addendum)
Natalie Gilbert, thank you for joining [name], PA for today's virtual visit.  While this provider is not your primary care provider (PCP), if your PCP is located in our provider database this encounter information will be shared with them immediately following your visit.   [place] account gives you access to today's visit and all your visits, tests, and labs performed at [facility] " click here if you don't have a [facility] account or go to [email or web address]  Consent: (Patient) Natalie Gilbert provided verbal consent for this virtual visit at the beginning of the encounter.  Current Medications:  Current Outpatient Medications:    [START ON 03/24/2022] ketoconazole (NIZORAL) 2 % shampoo, Apply 1 Application topically 2 (two) times a week. Lather, leave on for 88mn prior to rinsing off., Disp: 120 mL, Rfl: 0   albuterol (VENTOLIN HFA) 108 (90 Base) MCG/ACT inhaler, Inhale 2 puffs into the lungs every 6 (six) hours as needed for wheezing or shortness of breath., Disp: 6.7 g, Rfl: 0   benzonatate (TESSALON PERLES) 100 MG capsule, Take 1 capsule (100 mg total) by mouth 3 (three) times daily as needed., Disp: 20 capsule, Rfl: 0   cetirizine (ZYRTEC) 10 MG tablet, TAKE 1 TABLET BY MOUTH EVERY DAY, Disp: 30 tablet, Rfl: 0   clobetasol ointment (TEMOVATE) 0.05 %, Apply to affected areas daily at night, Disp: 60 g, Rfl: 0   dupilumab (DUPIXENT) 300 MG/2ML prefilled syringe, Use 1 injection under the skin every other week, Disp: 4 mL, Rfl: 6   MICROGESTIN FE 1/20 1-20 MG-MCG tablet, , Disp: , Rfl:    ondansetron (ZOFRAN) 4 MG tablet, Take 1 tablet (4 mg total) by mouth as directed for 2 doses. Take one Zofran 4 mg tablet 30-60 minutes before each prep dose, Disp: 2 tablet, Rfl: 0   Medications ordered in this encounter:  Meds ordered this encounter  Medications   ketoconazole (NIZORAL) 2 % shampoo    Sig: Apply 1 Application topically 2 (two) times  a week. Lather, leave on for 570m prior to rinsing off.    Dispense:  120 mL    Refill:  0    Order Specific Question:   Supervising Provider    Answer:   LAChase Picket1[[id number]]   *If you need refills on other medications prior to your next appointment, please contact your pharmacy*  Follow-Up: Call back or seek an in-person evaluation if the symptoms worsen or if the condition fails to improve as anticipated.  [phone] Instructions Please start ketoconazole 2% shampoo. Lather this on the affected areas of your face, leave on for 5 minutes, then rinse off. Do not use more than 2-3x/ week to prevent dry skin. Please wait to see if you need to restart your 2.5% hydrocortisone Follow up with dermatology or your PCP if your symptoms persist or worsen.   If you have been instructed to have an in-person evaluation today at a local Urgent Care facility, please use the link below. It will take you to a list of all of our available [facility], including address, phone number and hours of operation. Please do not delay care.  [facility]  If you or a family member do not have a primary care provider, use the link below to schedule a visit and establish care. When you choose a [facility] primary care physician or advanced practice provider,  you gain a long-term partner in health. Find a Primary Care Provider  Learn more about [facility]'s in-office and virtual care options: [facility]

## 2022-03-22 NOTE — Progress Notes (Signed)
Virtual Visit Consent   Natalie Gilbert, you are scheduled for a virtual visit with a [facility] provider today. Just as with appointments in the office, your consent must be obtained to participate. Your consent will be active for this visit and any virtual visit you may have with one of our providers in the next 365 days. If you have a MyChart account, a copy of this consent can be sent to you electronically.  As this is a virtual visit, video technology does not allow for your provider to perform a traditional examination. This may limit your provider's ability to fully assess your condition. If your provider identifies any concerns that need to be evaluated in person or the need to arrange testing (such as labs, EKG, etc.), we will make arrangements to do so. Although advances in technology are sophisticated, we cannot ensure that it will always work on either your end or our end. If the connection with a video visit is poor, the visit may have to be switched to a telephone visit. With either a video or telephone visit, we are not always able to ensure that we have a secure connection.  By engaging in this virtual visit, you consent to the provision of healthcare and authorize for your insurance to be billed (if applicable) for the services provided during this visit. Depending on your insurance coverage, you may receive a charge related to this service.  I need to obtain your verbal consent now. Are you willing to proceed with your visit today? Natalie Gilbert has provided verbal consent on 03/22/2022 for a virtual visit (video or telephone). Natalie Malling, PA  Date: 03/22/2022 2:55 PM  Virtual Visit via Video Note   I, Natalie Gilbert, connected with  Natalie Gilbert  ([id number], Oct 17, 1975) on 03/22/22 at  2:45 PM EST by a video-enabled telemedicine application and verified that I am speaking with the correct person using two identifiers.  Location: Patient: Virtual Visit Location Patient:  Other: work Provider: Ecologist: Home Office   I discussed the limitations of evaluation and management by telemedicine and the availability of in person appointments. The patient expressed understanding and agreed to proceed.    History of Present Illness: Natalie Gilbert is a 47 y.o. who identifies as a female who was assigned female at birth, and is being seen today for rash.  HPI: 47yo female with known hx of atopic dermatitis, dyshidrotic hand dermatitis and allergies presents today due to rash on her face. She states that on Sunday she had a small bump around her lips. She reports that spread to several more bumps and also to her cheeks. Yesterday evening, pt used topical 2.5% hydrocortisone due to feeling like this was a dermatitis. Pt states that today around 9am at work, she noted redness and itchiness to her skin around her T-zone, nasolabial folds, and around her mouth. Has not tried any medications on it today, but did wash the area with soap and water and feels that it is slightly better than this morning. Denies any new medications; did just complete a steroid dose pack one week ago. No new cosmetic products or soaps. Denies any ocular sx. No fever. No rash in other locations.   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 Lather, leave  on for 36mn prior to rinsing off., Disp: 120 mL, Rfl: 0   albuterol (VENTOLIN HFA) 108 (90 Base) MCG/ACT inhaler, Inhale 2 puffs into the lungs every 6 (six) hours as needed for wheezing or shortness of breath., Disp: 6.7 g, Rfl: 0   benzonatate (TESSALON PERLES) 100 MG capsule, Take 1 capsule (100 mg total) by mouth 3 (three) times daily as needed., Disp: 20 capsule, Rfl: 0   cetirizine (ZYRTEC) 10 MG tablet, TAKE 1 TABLET BY MOUTH EVERY DAY, Disp: 30 tablet, Rfl: 0   clobetasol ointment (TEMOVATE) 0.05 %, Apply to affected areas daily at night, Disp: 60 g, Rfl: 0   dupilumab (DUPIXENT) 300 MG/2ML prefilled syringe, Use 1 injection under the skin every other week, Disp: 4 mL, Rfl: 6   MICROGESTIN FE 1/20 1-20 MG-MCG tablet, , Disp: , Rfl:    ondansetron (ZOFRAN) 4 MG tablet, Take 1 tablet (4 mg total) by mouth as directed for 2 doses. Take one Zofran 4 mg tablet 30-60 minutes before each prep dose, Disp: 2 tablet, Rfl: 0  Observations/Objective: Patient is well-developed, well-nourished in no acute distress.  Resting comfortably at work, sitting upright in chair.  Head is normocephalic, atraumatic.  No labored breathing.  Speech is clear and coherent with logical content.  Patient is alert and oriented at baseline.  Slight erythema to T zone and nasolabial folds. No visible papules or pustules  Assessment and Plan: 1. Seborrheic dermatitis  I suspect given location and worsening sx s/p use of topical steroid cream that patient is likely experiencing seborrheic dermatitis. Will do ketoconazole 2% shampoo to lather to affected areas of face. Only restart topical hydrocortisone if no resolution. F/U with derm if sx persist.  Follow Up Instructions: I discussed the assessment and treatment plan with the patient. The patient was provided an opportunity to ask questions and all were answered. The patient agreed with the plan and demonstrated an understanding of the instructions.  A copy of  instructions were sent to the patient via MyChart unless otherwise noted below.     The patient was advised to call back or seek an in-person evaluation if the symptoms worsen or if the condition fails to improve as anticipated.  Time:  I spent 9 minutes with the patient via telehealth technology discussing the above problems/concerns.    [place] PA

## 2022-03-28 ENCOUNTER — Other Ambulatory Visit (HOSPITAL_COMMUNITY): Payer: Self-pay

## 2022-03-30 ENCOUNTER — Other Ambulatory Visit (HOSPITAL_COMMUNITY): Payer: Self-pay

## 2022-04-05 ENCOUNTER — Other Ambulatory Visit (HOSPITAL_COMMUNITY): Payer: Self-pay

## 2022-04-06 ENCOUNTER — Other Ambulatory Visit (HOSPITAL_COMMUNITY): Payer: Self-pay

## 2022-04-07 ENCOUNTER — Other Ambulatory Visit (HOSPITAL_COMMUNITY): Payer: Self-pay

## 2022-04-08 ENCOUNTER — Other Ambulatory Visit (HOSPITAL_COMMUNITY): Payer: Self-pay

## 2022-04-18 ENCOUNTER — Ambulatory Visit
Admission: EM | Admit: 2022-04-18 | Discharge: 2022-04-18 | Disposition: A | Discharge status: Home / Self Care | Payer: 59 | Attending: Family | Admitting: Family

## 2022-04-18 ENCOUNTER — Other Ambulatory Visit (HOSPITAL_COMMUNITY): Payer: Self-pay

## 2022-04-18 DIAGNOSIS — M6289 Other specified disorders of muscle: Secondary | ICD-10-CM | POA: Diagnosis not present

## 2022-04-18 DIAGNOSIS — M545 Low back pain, unspecified: Secondary | ICD-10-CM | POA: Diagnosis not present

## 2022-04-18 MED ORDER — DICLOFENAC SODIUM 75 MG PO TBEC
75.0000 mg | DELAYED_RELEASE_TABLET | Freq: Two times a day (BID) | ORAL | 0 refills | Status: AC | PRN
  Filled 2022-04-18: qty 15, 8d supply, fill #0

## 2022-04-18 MED ORDER — CYCLOBENZAPRINE HCL 10 MG PO TABS
ORAL_TABLET | ORAL | 0 refills | Status: AC
  Filled 2022-04-18: qty 15, 5d supply, fill #0

## 2022-04-18 NOTE — ED Triage Notes (Signed)
Pt presents with left lower back pain since yesterday with no known injury.

## 2022-04-18 NOTE — ED Provider Notes (Signed)
[facility]    CSN: [id number] Arrival date & time: 04/18/22  1015      History   Chief Complaint Chief Complaint  Patient presents with   Back Pain    HPI Natalie Gilbert is a 47 y.o. female.   47 year old female presents with left lower back pain that started yesterday. When she woke up yesterday morning and tried to get out of bed, she started experiencing left lower back pain. The pain continued throughout the day and was worse with movement. Pain has stayed localized and she denies any radiation of pain. No numbness or tingling. Denies any fever, shortness of breath, difficulty urinating, hematuria or pain with urination. Currently on her menses and takes Microgestin Fe 1/20 daily. No known trauma or injury but does move/transport other patients at work. No previous history of similar symptoms. Has tried Ibuprofen '800mg'$  with minimal relief. Other chronic health issues include environmental allergies which is controlled with Zyrtec and Eczema which is controlled with Dupixent injections.   The history is provided by the patient.    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 No pertinent surgical history.  OB History   No obstetric history on file.      Home Medications    Prior to Admission medications   Medication Sig Start Date End Date Taking? Authorizing Provider   cyclobenzaprine (FLEXERIL) 10 MG tablet Take 1/2 to 1 tablet by mouth every 8 hours as needed for muscle pain/spasms. May cause drowsiness. 04/18/22  Yes Alyric Parkin, Nicholes Stairs, NP  diclofenac (VOLTAREN) 75 MG EC tablet Take 1 tablet (75 mg total) by mouth every 12 (twelve) hours as needed for moderate pain. 04/18/22  Yes Lamberto Dinapoli, Nicholes Stairs, NP  albuterol (VENTOLIN HFA) 108 (90 Base) MCG/ACT inhaler Inhale 2 puffs into the lungs every 6 (six) hours as needed for wheezing or shortness of breath. 03/07/22   Sharion Balloon, FNP  cetirizine (ZYRTEC) 10 MG tablet TAKE 1 TABLET BY MOUTH EVERY DAY 03/21/19   Ambs, Kathrine Cords, FNP  clobetasol ointment (TEMOVATE) 0.05 % Apply to affected areas daily at night 04/12/21     dupilumab (DUPIXENT) 300 MG/2ML prefilled syringe Use 1 injection under the skin every other week 11/15/21   Tresa Garter, MD  ketoconazole (NIZORAL) 2 % shampoo Apply 1 Application topically 2 (two) times a week. Lather, leave on for 90mn prior to rinsing off. 03/24/22   CChaney Malling PUtah MICROGESTIN FE 1/20 1-20 MG-MCG tablet  09/19/12   [provider]  ondansetron (ZOFRAN) 4 MG tablet Take 1 tablet (4 mg total) by mouth as directed for 2 doses. Take one Zofran 4 mg tablet 30-60 minutes before each prep dose 01/10/22   Armbruster, SCarlota Raspberry MD    Family History Family History  Problem Relation Age of Onset   Hypertension Mother  Heart murmur Father    Hypertension Brother    Diabetes Maternal Grandmother    Breast cancer Paternal Grandmother    Colon cancer Neg Hx    Colon polyps Neg Hx    Esophageal cancer Neg Hx    Stomach cancer Neg Hx    Rectal cancer Neg Hx     Social History Social History   Tobacco Use   Smoking status: Never   Smokeless tobacco: Never  Vaping Use   Vaping Use: Never used  Substance Use Topics   Alcohol use: Never   Drug use: Never     Allergies   Patient has no known allergies.   Review of Systems Review of Systems   Constitutional:  Negative for appetite change, chills, diaphoresis, fatigue and fever.  Respiratory:  Negative for chest tightness and shortness of breath.   Gastrointestinal:  Negative for abdominal pain, nausea and vomiting.  Genitourinary:  Negative for decreased urine volume, difficulty urinating, dysuria, flank pain and hematuria.  Musculoskeletal:  Positive for back pain. Negative for gait problem, neck pain and neck stiffness.  Skin:  Negative for color change and rash.  Allergic/Immunologic: Positive for environmental allergies. Negative for food allergies and immunocompromised state.  Neurological:  Negative for dizziness, tremors, seizures, syncope, speech difficulty, weakness, numbness and headaches.  Hematological:  Negative for adenopathy. Does not bruise/bleed easily.     Physical Exam Triage Vital Signs ED Triage Vitals  Enc Vitals Group     BP 04/18/22 1129 (!) 151/93     Pulse Rate 04/18/22 1129 83     Resp 04/18/22 1129 18     Temp 04/18/22 1129 98.4 F (36.9 C)     Temp Source 04/18/22 1129 Oral     SpO2 04/18/22 1129 96 %     Weight --      Height --      Head Circumference --      Peak Flow --      Pain Score 04/18/22 1128 6     Pain Loc --      Pain Edu? --      Excl. in [place]? --    No data found.  Updated Vital Signs BP (!) 151/93 (BP Location: Left Arm)   Pulse 83   Temp 98.4 F (36.9 C) (Oral)   Resp 18   SpO2 96%   Visual Acuity Right Eye Distance:   Left Eye Distance:   Bilateral Distance:    Right Eye Near:   Left Eye Near:    Bilateral Near:     Physical Exam Vitals and nursing note reviewed.  Constitutional:      General: She is awake. She is not in acute distress.    Comments: She is sitting on the exam table in no acute distress but appears uncomfortable when changing positions.   HENT:     Head: Normocephalic and atraumatic.     Right Ear: Hearing normal.     Left Ear: Hearing normal.  Eyes:     Extraocular Movements:  Extraocular movements intact.     Conjunctiva/sclera: Conjunctivae normal.  Cardiovascular:     Rate and Rhythm: Normal rate and regular rhythm.     Heart sounds: Normal heart sounds. No murmur heard. Pulmonary:     Effort: Pulmonary effort is normal. No respiratory distress.     Breath sounds: Normal breath sounds and air entry. No decreased air movement. No decreased breath sounds, wheezing, rhonchi or rales.  Abdominal:  General: Abdomen is flat.     Palpations: Abdomen is soft.     Tenderness: There is no abdominal tenderness. There is no right CVA tenderness, left CVA tenderness, guarding or rebound.  Musculoskeletal:        General: Tenderness present.     Cervical back: Normal and normal range of motion.     Thoracic back: Normal.     Lumbar back: Tenderness present. No swelling, edema or signs of trauma. Decreased range of motion. Positive left straight leg raise test. Negative right straight leg raise test.       Back:     Comments: Decreased range of motion of lower lumbar area and pain in left lower lumbar area with flexion and full extension. Tender to palpation of muscle group. No redness or rash. No CVA tenderness. No neuro deficits noted.   Skin:    General: Skin is warm and dry.     Capillary Refill: Capillary refill takes less than 2 seconds.     Findings: No erythema or rash.  Neurological:     General: No focal deficit present.     Mental Status: She is alert and oriented to person, place, and time.     Sensory: Sensation is intact. No sensory deficit.     Motor: Motor function is intact.     Deep Tendon Reflexes: Reflexes are normal and symmetric.  Psychiatric:        Mood and Affect: Mood normal.        Behavior: Behavior normal. Behavior is cooperative.        Thought Content: Thought content normal.        Judgment: Judgment normal.      UC Treatments / Results  Labs (all labs ordered are listed, but only abnormal results are displayed) Labs  Reviewed - No data to display  EKG   Radiology No results found.  Procedures Procedures (including critical care time)  Medications Ordered in UC Medications - No data to display  Initial Impression / Assessment and Plan / UC Course  I have reviewed the triage vital signs and the nursing notes.  Pertinent labs & imaging results that were available during my care of the patient were reviewed by me and considered in my medical decision making (see chart for details).     Reviewed with patient that she probably has a lower lumbar muscle strain. Doubt renal calculi at this time but continue to monitor symptoms. No imaging indicated at this time. Recommend trial Voltaren '75mg'$  every 12 hours as needed for pain- take with food. May take Flexeril '10mg'$  1/2 to 1 whole tablet every 8 hours as needed for muscle spasms/pain. May apply warm compresses to area and alternate with cool compresses as needed for comfort. Follow-up in 2 to 3 days if not improving.    Final Clinical Impressions(s) / UC Diagnoses   Final diagnoses:  Acute left-sided low back pain without sciatica  Muscle tightness     Discharge Instructions      Recommend start Voltaren '75mg'$  every 12 hours as needed for pain.  May take Flexeril '10mg'$  muscle relaxer- take 1/2 to 1 whole tablet every 8 hours as needed for muscle spasms/pain- may cause drowsiness. May apply warm compresses to area for comfort. Follow-up in 2 to 3 days if not improving.     ED Prescriptions     Medication Sig Dispense Auth. Provider   diclofenac (VOLTAREN) 75 MG EC tablet Take 1 tablet (75 mg total) by  mouth every 12 (twelve) hours as needed for moderate pain. 15 tablet Katy Apo, NP   cyclobenzaprine (FLEXERIL) 10 MG tablet Take 1/2 to 1 tablet by mouth every 8 hours as needed for muscle pain/spasms. May cause drowsiness. 15 tablet Emmalee Solivan, Nicholes Stairs, NP      PDMP not reviewed this encounter.   Katy Apo, NP 04/18/22 2316

## 2022-04-18 NOTE — Discharge Instructions (Signed)
Recommend start Voltaren '75mg'$  every 12 hours as needed for pain.  May take Flexeril '10mg'$  muscle relaxer- take 1/2 to 1 whole tablet every 8 hours as needed for muscle spasms/pain- may cause drowsiness. May apply warm compresses to area for comfort. Follow-up in 2 to 3 days if not improving.

## 2022-05-04 ENCOUNTER — Other Ambulatory Visit (HOSPITAL_COMMUNITY): Payer: Self-pay

## 2022-05-06 ENCOUNTER — Other Ambulatory Visit: Payer: Self-pay

## 2022-05-09 ENCOUNTER — Other Ambulatory Visit (HOSPITAL_COMMUNITY): Payer: Self-pay

## 2022-05-31 ENCOUNTER — Other Ambulatory Visit (HOSPITAL_COMMUNITY): Payer: Self-pay

## 2022-06-02 DIAGNOSIS — N760 Acute vaginitis: Secondary | ICD-10-CM | POA: Diagnosis not present

## 2022-06-02 DIAGNOSIS — N898 Other specified noninflammatory disorders of vagina: Secondary | ICD-10-CM | POA: Diagnosis not present

## 2022-06-02 DIAGNOSIS — Z202 Contact with and (suspected) exposure to infections with a predominantly sexual mode of transmission: Secondary | ICD-10-CM | POA: Diagnosis not present

## 2022-06-06 ENCOUNTER — Other Ambulatory Visit: Payer: Self-pay

## 2022-06-07 ENCOUNTER — Other Ambulatory Visit (HOSPITAL_COMMUNITY): Payer: Self-pay

## 2022-06-28 ENCOUNTER — Other Ambulatory Visit (HOSPITAL_COMMUNITY): Payer: Self-pay

## 2022-06-29 ENCOUNTER — Other Ambulatory Visit (HOSPITAL_COMMUNITY): Payer: Self-pay

## 2022-06-29 MED ORDER — DUPIXENT 300 MG/2ML ~~LOC~~ SOSY: PREFILLED_SYRINGE | SUBCUTANEOUS | 0 refills | Status: DC

## 2022-06-30 ENCOUNTER — Other Ambulatory Visit: Payer: Self-pay | Admitting: Pharmacist

## 2022-06-30 ENCOUNTER — Other Ambulatory Visit: Payer: Self-pay

## 2022-06-30 ENCOUNTER — Other Ambulatory Visit (HOSPITAL_COMMUNITY): Payer: Self-pay

## 2022-06-30 MED ORDER — DUPIXENT 300 MG/2ML ~~LOC~~ SOSY
PREFILLED_SYRINGE | SUBCUTANEOUS | 0 refills | Status: DC
  Filled 2022-06-30: qty 4, 28d supply, fill #0

## 2022-07-01 ENCOUNTER — Other Ambulatory Visit: Payer: Self-pay

## 2022-07-08 DIAGNOSIS — Z1389 Encounter for screening for other disorder: Secondary | ICD-10-CM | POA: Diagnosis not present

## 2022-07-08 DIAGNOSIS — Z1231 Encounter for screening mammogram for malignant neoplasm of breast: Secondary | ICD-10-CM | POA: Diagnosis not present

## 2022-07-08 DIAGNOSIS — Z3041 Encounter for surveillance of contraceptive pills: Secondary | ICD-10-CM | POA: Diagnosis not present

## 2022-07-08 DIAGNOSIS — Z01419 Encounter for gynecological examination (general) (routine) without abnormal findings: Secondary | ICD-10-CM | POA: Diagnosis not present

## 2022-07-08 DIAGNOSIS — Z202 Contact with and (suspected) exposure to infections with a predominantly sexual mode of transmission: Secondary | ICD-10-CM | POA: Diagnosis not present

## 2022-07-08 DIAGNOSIS — Z130 Encounter for screening for diseases of the blood and blood-forming organs and certain disorders involving the immune mechanism: Secondary | ICD-10-CM | POA: Diagnosis not present

## 2022-07-27 ENCOUNTER — Other Ambulatory Visit (HOSPITAL_COMMUNITY): Payer: Self-pay

## 2022-07-27 ENCOUNTER — Other Ambulatory Visit: Payer: Self-pay

## 2022-07-27 ENCOUNTER — Other Ambulatory Visit: Payer: Self-pay | Admitting: Internal Medicine

## 2022-07-28 ENCOUNTER — Other Ambulatory Visit (HOSPITAL_COMMUNITY): Payer: Self-pay

## 2022-08-01 ENCOUNTER — Other Ambulatory Visit (HOSPITAL_COMMUNITY): Payer: Self-pay

## 2022-08-11 ENCOUNTER — Other Ambulatory Visit: Payer: Self-pay

## 2022-08-11 ENCOUNTER — Other Ambulatory Visit (HOSPITAL_COMMUNITY): Payer: Self-pay

## 2022-08-11 ENCOUNTER — Other Ambulatory Visit: Payer: Self-pay | Admitting: Pharmacist

## 2022-08-11 MED ORDER — DUPIXENT 300 MG/2ML ~~LOC~~ SOSY: PREFILLED_SYRINGE | SUBCUTANEOUS | 1 refills | Status: DC

## 2022-08-11 MED ORDER — DUPIXENT 300 MG/2ML ~~LOC~~ SOSY
PREFILLED_SYRINGE | SUBCUTANEOUS | 1 refills | Status: DC
  Filled 2022-08-11: qty 4, 28d supply, fill #0
  Filled 2022-08-31: qty 4, 28d supply, fill #1

## 2022-08-31 ENCOUNTER — Other Ambulatory Visit (HOSPITAL_COMMUNITY): Payer: Self-pay

## 2022-09-09 ENCOUNTER — Other Ambulatory Visit (HOSPITAL_COMMUNITY): Payer: Self-pay

## 2022-09-15 ENCOUNTER — Other Ambulatory Visit (HOSPITAL_COMMUNITY): Payer: Self-pay

## 2022-09-15 ENCOUNTER — Other Ambulatory Visit: Payer: Self-pay

## 2022-09-15 DIAGNOSIS — L209 Atopic dermatitis, unspecified: Secondary | ICD-10-CM | POA: Diagnosis not present

## 2022-09-15 MED ORDER — TRIAMCINOLONE ACETONIDE 0.1 % EX CREA
1.0000 | TOPICAL_CREAM | Freq: Two times a day (BID) | CUTANEOUS | 1 refills | Status: AC
  Filled 2022-09-15: qty 454, 14d supply, fill #0
  Filled 2022-09-15: qty 454, 30d supply, fill #0
  Filled 2023-01-17: qty 454, 30d supply, fill #1

## 2022-09-15 MED ORDER — DESONIDE 0.05 % EX OINT
1.0000 | TOPICAL_OINTMENT | Freq: Two times a day (BID) | CUTANEOUS | 1 refills | Status: DC
  Filled 2022-09-15: qty 30, 15d supply, fill #0
  Filled 2022-09-25 – 2022-09-27 (×2): qty 30, 15d supply, fill #1

## 2022-09-15 MED ORDER — DUPIXENT 300 MG/2ML ~~LOC~~ SOSY: PREFILLED_SYRINGE | SUBCUTANEOUS | 5 refills | Status: DC

## 2022-09-16 ENCOUNTER — Other Ambulatory Visit (HOSPITAL_COMMUNITY): Payer: Self-pay

## 2022-09-21 ENCOUNTER — Encounter (INDEPENDENT_AMBULATORY_CARE_PROVIDER_SITE_OTHER): Payer: Self-pay

## 2022-09-26 ENCOUNTER — Other Ambulatory Visit (HOSPITAL_COMMUNITY): Payer: Self-pay

## 2022-09-26 ENCOUNTER — Other Ambulatory Visit: Payer: Self-pay

## 2022-09-30 ENCOUNTER — Other Ambulatory Visit (HOSPITAL_COMMUNITY): Payer: Self-pay

## 2022-10-05 ENCOUNTER — Other Ambulatory Visit (HOSPITAL_COMMUNITY): Payer: Self-pay

## 2022-10-05 ENCOUNTER — Other Ambulatory Visit: Payer: Self-pay

## 2022-10-05 ENCOUNTER — Other Ambulatory Visit: Payer: Self-pay | Admitting: Pharmacist

## 2022-10-05 MED ORDER — DUPIXENT 300 MG/2ML ~~LOC~~ SOSY
PREFILLED_SYRINGE | SUBCUTANEOUS | 5 refills | Status: DC
  Filled 2022-10-05: qty 4, 28d supply, fill #0
  Filled 2022-11-01: qty 4, 28d supply, fill #1
  Filled 2022-11-30: qty 4, 28d supply, fill #2
  Filled 2022-12-27 – 2023-01-09 (×2): qty 4, 28d supply, fill #3
  Filled 2023-02-08: qty 4, 28d supply, fill #4

## 2022-10-10 ENCOUNTER — Other Ambulatory Visit (HOSPITAL_COMMUNITY): Payer: Self-pay

## 2022-10-12 NOTE — Progress Notes (Unsigned)
HPI: Natalie Gilbert is a 47 y.o. female with PMHx significant for GAD,HLD,and eczema here today for chronic disease management.  Last seen on 11/12/21.  Negative for new health problems since her last visit.  She has seen dermatology and gynecology since her last visit. Eczema better controlled with Dupixent.  HLD: She is on non pharmacologic treatment. She has made diet changes, walking more at work, avoiding fried foods. FHx hyperlipidemia, father had it.  Last A1C 5.7,  Eating chicken, switched from pork chops to [place] bacon. She is not eating vegetables daily.  She reports that she had LPa checked in 04/2022 as part of stady participation, it was high, she is not sure about number.  Lab Results  Component Value Date   CHOL 243 (H) 11/12/2021   HDL 52.90 11/12/2021   LDLCALC 156 (H) 11/12/2021   LDLDIRECT 141.0 08/12/2020   TRIG 171.0 (H) 11/12/2021   CHOLHDL 5 11/12/2021   Lab Results  Component Value Date   NA 140 11/12/2021   CL 106 11/12/2021   K 4.1 11/12/2021   CO2 25 11/12/2021   BUN 12 11/12/2021   CREATININE 0.95 11/12/2021   GFR 72.56 11/12/2021   CALCIUM 9.4 11/12/2021   ALBUMIN 4.3 10/28/2014   GLUCOSE 95 11/12/2021   Review of Systems  Constitutional:  Negative for activity change, appetite change and fever.  HENT:  Negative for mouth sores, nosebleeds and sore throat.   Eyes:  Negative for redness and visual disturbance.  Respiratory:  Negative for cough, shortness of breath and wheezing.   Cardiovascular:  Negative for chest pain, palpitations and leg swelling.  Gastrointestinal:  Negative for abdominal pain, nausea and vomiting.       Negative for changes in bowel habits.  Endocrine: Negative for cold intolerance, heat intolerance, polydipsia, polyphagia and polyuria.  Genitourinary:  Negative for decreased urine volume and hematuria.  Allergic/Immunologic: Positive for environmental allergies.  Neurological:  Negative for syncope, weakness  and headaches.  See other pertinent positives and negatives in HPI.  Current Outpatient Medications on File Prior to Visit  Medication Sig Dispense Refill   albuterol (VENTOLIN HFA) 108 (90 Base) MCG/ACT inhaler Inhale 2 puffs into the lungs every 6 (six) hours as needed for wheezing or shortness of breath. 6.7 g 0   cetirizine (ZYRTEC) 10 MG tablet TAKE 1 TABLET BY MOUTH EVERY DAY 30 tablet 0   clobetasol ointment (TEMOVATE) 0.05 % Apply to affected areas daily at night 60 g 0   cyclobenzaprine (FLEXERIL) 10 MG tablet Take 1/2 to 1 tablet by mouth every 8 hours as needed for muscle pain/spasms. May cause drowsiness. 15 tablet 0   desonide (DESOWEN) 0.05 % ointment Apply to affected area(s) twice daily for 14 days. 30 g 1   diclofenac (VOLTAREN) 75 MG EC tablet Take 1 tablet (75 mg total) by mouth every 12 (twelve) hours as needed for moderate pain. 15 tablet 0   dupilumab (DUPIXENT) 300 MG/2ML prefilled syringe USE 1 INJECTION EVERY OTHER WEEK UNDER THE SKIN 30 days 4 mL 5   ketoconazole (NIZORAL) 2 % shampoo Apply 1 Application topically 2 (two) times a week. Lather, leave on for prior to rinsing off. 120 mL 0   MICROGESTIN FE 1/20 1-20 MG-MCG tablet      ondansetron (ZOFRAN) 4 MG tablet Take 1 tablet (4 mg total) by mouth as directed for 2 doses. Take one Zofran 4 mg tablet 30-60 minutes before each prep dose 2 tablet 0  triamcinolone cream (KENALOG) 0.1 % Apply to affected area(s) twice daily for 14 days. 454 g 1   No current facility-administered medications on file prior to visit.    Past Medical History:  Diagnosis Date   Allergy    seasonal, animal dander   Anxiety    Depression    Eczema    Heart murmur    No Known Allergies  Social History   Socioeconomic History   Marital status: Single    Spouse name: Not on file   Number of children: Not on file   Years of education: Not on file   Highest education level: Bachelor's degree (e.g., BA, AB, BS)  Occupational  History   Not on file  Tobacco Use   Smoking status: Never   Smokeless tobacco: Never  Vaping Use   Vaping status: Never Used  Substance and Sexual Activity   Alcohol use: Never   Drug use: Never   Sexual activity: Yes    Birth control/protection: Spermicide, Pill  Other Topics Concern   Not on file  Social History Narrative   Not on file   Social Determinants of Health   Financial Resource Strain: Low Risk  (10/13/2022)   Overall Financial Resource Strain (CARDIA)    Difficulty of Paying Living Expenses: Not hard at all  Food Insecurity: No Food Insecurity (10/13/2022)   Hunger Vital Sign    Worried About Running Out of Food in the Last Year: Never true    Ran Out of Food in the Last Year: Never true  Transportation Needs: No Transportation Needs (10/13/2022)   PRAPARE - Transportation    Lack of Transportation (Medical): No    Lack of Transportation (Non-Medical): No  Physical Activity: Sufficiently Active (10/13/2022)   Exercise Vital Sign    Days of Exercise per Week: 3 days    Minutes of Exercise per Session: 150+ min  Stress: No Stress Concern Present (10/13/2022)   [employer] of Occupational Health - Occupational Stress Questionnaire    Feeling of Stress : Only a little  Social Connections: Moderately Integrated (10/13/2022)   Social Connection and Isolation Panel [NHANES]    Frequency of Communication with Friends and Family: More than three times a week    Frequency of Social Gatherings with Friends and Family: Once a week    Attends Religious Services: More than 4 times per year    Active Member of [employer] or Organizations: Yes    Attends Banker Meetings: More than 4 times per year    Marital Status: Never married   Vitals:   10/14/22 1021  BP: 128/82  Pulse: 65  Resp: 12  Temp: 98.5 F (36.9 C)  SpO2: 98%   Wt Readings from Last 3 Encounters:  10/14/22 177 lb 8 oz (80.5 kg)  02/08/22 188 lb (85.3 kg)  01/10/22 188 lb (85.3 kg)   Body  mass index is 29.72 kg/m.  Physical Exam Vitals and nursing note reviewed.  Constitutional:      General: She is not in acute distress.    Appearance: She is well-developed.  HENT:     Head: Normocephalic and atraumatic.     Mouth/Throat:     Mouth: Mucous membranes are moist.     Pharynx: Oropharynx is clear.  Eyes:     Conjunctiva/sclera: Conjunctivae normal.  Cardiovascular:     Rate and Rhythm: Normal rate and regular rhythm.     Pulses:          Dorsalis  pedis pulses are 2+ on the right side and 2+ on the left side.     Heart sounds: No murmur heard. Pulmonary:     Effort: Pulmonary effort is normal. No respiratory distress.     Breath sounds: Normal breath sounds.  Abdominal:     Palpations: Abdomen is soft. There is no hepatomegaly or mass.     Tenderness: There is no abdominal tenderness.  Lymphadenopathy:     Cervical: No cervical adenopathy.  Skin:    General: Skin is warm.     Findings: No erythema or rash.  Neurological:     General: No focal deficit present.     Mental Status: She is alert and oriented to person, place, and time.     Cranial Nerves: No cranial nerve deficit.     Gait: Gait normal.  Psychiatric:        Mood and Affect: Mood and affect normal.   ASSESSMENT AND PLAN:  Natalie Gilbert was seen today for medical management of chronic issues.  Diagnoses and all orders for this visit: Lab Results  Component Value Date   HGBA1C 5.5 10/14/2022   Lab Results  Component Value Date   CHOL 268 (H) 10/14/2022   HDL 46.90 10/14/2022   LDLCALC 191 (H) 10/14/2022   LDLDIRECT 141.0 08/12/2020   TRIG 147.0 10/14/2022   CHOLHDL 6 10/14/2022    Hyperlipidemia, unspecified hyperlipidemia type Assessment & Plan: Continue pharmacologic treatment for now. We discussed current guidelines in regard to pharmacologic treatment and CV benefits of statins. 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  Orders: -     Comprehensive metabolic panel; Future -     Lipid panel; Future -     Hemoglobin A1c; Future  Diabetes mellitus screening -     Hemoglobin A1c; Future  Atopic dermatitis, unspecified type Assessment & Plan: Improved since started on Dupixent. Follows with dermatologist. She would like lab results to be faxed to her dermatologist, Dr Wallace Cullens.   Return in about 1 year (around 10/14/2023) for CPE.  Jaydin Jalomo G. [place], MD  [facility]. [place] office.

## 2022-10-14 ENCOUNTER — Encounter: Payer: Self-pay | Admitting: Family Medicine

## 2022-10-14 ENCOUNTER — Ambulatory Visit: Payer: 59 | Admitting: Family Medicine

## 2022-10-14 VITALS — BP 128/82 | HR 65 | Temp 98.5°F | Resp 12 | Ht 64.8 in | Wt 177.5 lb

## 2022-10-14 DIAGNOSIS — E785 Hyperlipidemia, unspecified: Secondary | ICD-10-CM

## 2022-10-14 DIAGNOSIS — L209 Atopic dermatitis, unspecified: Secondary | ICD-10-CM

## 2022-10-14 DIAGNOSIS — Z131 Encounter for screening for diabetes mellitus: Secondary | ICD-10-CM

## 2022-10-14 LAB — COMPREHENSIVE METABOLIC PANEL
ALT: 17 U/L (ref 0–35)
AST: 22 U/L (ref 0–37)
Albumin: 4.2 g/dL (ref 3.5–5.2)
Alkaline Phosphatase: 60 U/L (ref 39–117)
BUN: 11 mg/dL (ref 6–23)
CO2: 27 mEq/L (ref 19–32)
Calcium: 9.9 mg/dL (ref 8.4–10.5)
Chloride: 104 mEq/L (ref 96–112)
Creatinine, Ser: 0.97 mg/dL (ref 0.40–1.20)
GFR: 70.31 mL/min (ref >60.00)
Glucose, Bld: 93 mg/dL (ref 70–99)
Potassium: 4.4 mEq/L (ref 3.5–5.1)
Sodium: 138 mEq/L (ref 135–145)
Total Bilirubin: 0.4 mg/dL (ref 0.2–1.2)
Total Protein: 7.7 g/dL (ref 6.0–8.3)

## 2022-10-14 LAB — LIPID PANEL
Cholesterol: 268 mg/dL — ABNORMAL HIGH (ref 0–200)
HDL: 46.9 mg/dL (ref >39.00)
LDL Cholesterol: 191 mg/dL — ABNORMAL HIGH (ref 0–99)
NonHDL: 220.69
Total CHOL/HDL Ratio: 6
Triglycerides: 147 mg/dL (ref 0.0–149.0)
VLDL: 29.4 mg/dL (ref 0.0–40.0)

## 2022-10-14 LAB — HEMOGLOBIN A1C: Hgb A1c MFr Bld: 5.5 % (ref 4.6–6.5)

## 2022-10-14 NOTE — Patient Instructions (Signed)
A few things to remember from today's visit:  Hyperlipidemia, unspecified hyperlipidemia type - Plan: Comprehensive metabolic panel, Lipid panel, Hemoglobin A1c  Diabetes mellitus screening - Plan: Hemoglobin A1c  If you need refills for medications you take chronically, please call your pharmacy. Do not use My Chart to request refills or for acute issues that need immediate attention. If you send a my chart message, it may take a few days to be addressed, specially if I am not in the office.  Please be sure medication list is accurate. If a new problem present, please set up appointment sooner than planned today.

## 2022-10-15 ENCOUNTER — Other Ambulatory Visit (HOSPITAL_COMMUNITY): Payer: Self-pay

## 2022-10-15 MED ORDER — ROSUVASTATIN CALCIUM 10 MG PO TABS
10.0000 mg | ORAL_TABLET | Freq: Every day | ORAL | 3 refills | Status: DC
  Filled 2022-10-15: qty 90, 90d supply, fill #0

## 2022-10-15 NOTE — Assessment & Plan Note (Addendum)
Continue pharmacologic treatment for now. We discussed current guidelines in regard to pharmacologic treatment and CV benefits of statins. 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

## 2022-10-15 NOTE — Assessment & Plan Note (Addendum)
Improved since started on Dupixent. Follows with dermatologist. She would like lab results to be faxed to her dermatologist, Dr Wallace Cullens.

## 2022-10-20 ENCOUNTER — Other Ambulatory Visit (HOSPITAL_COMMUNITY): Payer: Self-pay

## 2022-11-01 ENCOUNTER — Other Ambulatory Visit (HOSPITAL_COMMUNITY): Payer: Self-pay

## 2022-11-04 ENCOUNTER — Other Ambulatory Visit (HOSPITAL_COMMUNITY): Payer: Self-pay

## 2022-11-09 ENCOUNTER — Other Ambulatory Visit (HOSPITAL_COMMUNITY): Payer: Self-pay

## 2022-11-10 ENCOUNTER — Other Ambulatory Visit (HOSPITAL_COMMUNITY): Payer: Self-pay

## 2022-11-10 ENCOUNTER — Ambulatory Visit (INDEPENDENT_AMBULATORY_CARE_PROVIDER_SITE_OTHER): Payer: 59 | Admitting: Podiatry

## 2022-11-10 ENCOUNTER — Encounter: Payer: Self-pay | Admitting: Podiatry

## 2022-11-10 ENCOUNTER — Ambulatory Visit (INDEPENDENT_AMBULATORY_CARE_PROVIDER_SITE_OTHER): Payer: 59

## 2022-11-10 DIAGNOSIS — M722 Plantar fascial fibromatosis: Secondary | ICD-10-CM | POA: Diagnosis not present

## 2022-11-10 MED ORDER — MELOXICAM 15 MG PO TABS
15.0000 mg | ORAL_TABLET | Freq: Every day | ORAL | 3 refills | Status: AC
  Filled 2022-11-10: qty 30, 30d supply, fill #0

## 2022-11-10 MED ORDER — METHYLPREDNISOLONE 4 MG PO TBPK
ORAL_TABLET | ORAL | 0 refills | Status: AC
  Filled 2022-11-10: qty 21, 6d supply, fill #0

## 2022-11-10 NOTE — Progress Notes (Signed)
  Patient was seen, post visit with Dr. Al Corpus measured for custom molded foot orthotics.  Patient has plantar fasciitis with BIL foot pain stiffness. PATIENT IS CALLING INS TO VERIFY COVERAGE WILL CALL TO PROCEED OR CANCEL Patient will benefit from CFO's as they will help provide total contact to MLA's helping to better distribute body weight across BIL feet greater reducing plantar pressure and pain and to also encourage FF and RF alignment.  Patient was scanned items to be ordered and fit when in  [employer], CFo, CFm

## 2022-11-12 ENCOUNTER — Encounter (HOSPITAL_COMMUNITY): Payer: Self-pay

## 2022-11-13 NOTE — Progress Notes (Signed)
Subjective:  Patient ID: Natalie Gilbert, female    DOB: 12/13/1975,  MRN: [id number] HPI Chief Complaint  Patient presents with   Foot Pain    Plantar and posterior heel bilateral - aching intermittent since 2018, AM pain, did see podiatrist a couple years ago-used boots and ice then, not very consistent with treatment   New Patient (Initial Visit)    47 y.o. female presents with the above complaint.   Denies fever chills nausea mobic muscle aches pains calf pain back pain chest pain shortness of breath.  Past Medical History:  Diagnosis Date   Allergy    seasonal, animal dander   Anxiety    Depression    Eczema    Heart murmur    No past surgical history on file.  Current Outpatient Medications:    meloxicam (MOBIC) 15 MG tablet, Take 1 tablet (15 mg total) by mouth daily., Disp: 30 tablet, Rfl: 3   methylPREDNISolone (MEDROL DOSEPAK) 4 MG TBPK tablet, Take as directed per package instructions., Disp: 21 tablet, Rfl: 0   albuterol (VENTOLIN HFA) 108 (90 Base) MCG/ACT inhaler, Inhale 2 puffs into the lungs every 6 (six) hours as needed for wheezing or shortness of breath., Disp: 6.7 g, Rfl: 0   cetirizine (ZYRTEC) 10 MG tablet, TAKE 1 TABLET BY MOUTH EVERY DAY, Disp: 30 tablet, Rfl: 0   clobetasol ointment (TEMOVATE) 0.05 %, Apply to affected areas daily at night, Disp: 60 g, Rfl: 0   cyclobenzaprine (FLEXERIL) 10 MG tablet, Take 1/2 to 1 tablet by mouth every 8 hours as needed for muscle pain/spasms. May cause drowsiness., Disp: 15 tablet, Rfl: 0   desonide (DESOWEN) 0.05 % ointment, Apply to affected area(s) twice daily for 14 days., Disp: 30 g, Rfl: 1   diclofenac (VOLTAREN) 75 MG EC tablet, Take 1 tablet (75 mg total) by mouth every 12 (twelve) hours as needed for moderate pain., Disp: 15 tablet, Rfl: 0   dupilumab (DUPIXENT) 300 MG/2ML prefilled syringe, USE 1 INJECTION EVERY OTHER WEEK UNDER THE SKIN 30 days, Disp: 4 mL, Rfl: 5   ketoconazole (NIZORAL) 2 % shampoo, Apply 1  Application topically 2 (two) times a week. Lather, leave on for prior to rinsing off., Disp: 120 mL, Rfl: 0   MICROGESTIN FE 1/20 1-20 MG-MCG tablet, , Disp: , Rfl:    ondansetron (ZOFRAN) 4 MG tablet, Take 1 tablet (4 mg total) by mouth as directed for 2 doses. Take one Zofran 4 mg tablet 30-60 minutes before each prep dose, Disp: 2 tablet, Rfl: 0   rosuvastatin (CRESTOR) 10 MG tablet, Take 1 tablet (10 mg total) by mouth daily., Disp: 90 tablet, Rfl: 3   triamcinolone cream (KENALOG) 0.1 %, Apply to affected area(s) twice daily for 14 days., Disp: 454 g, Rfl: 1  Allergies  Allergen Reactions   Latex     Other Reaction(s): Unknown   Review of Systems Objective:  There were no vitals filed for this visit.  General: Well developed, nourished, in no acute distress, alert and oriented x3   Dermatological: Skin is warm, dry and supple bilateral. Nails x 10 are well maintained; remaining integument appears unremarkable at this time. There are no open sores, no preulcerative lesions, no rash or signs of infection present.  Vascular: Dorsalis Pedis artery and Posterior Tibial artery pedal pulses are 2/4 bilateral with immedate capillary fill time. Pedal hair growth present. No varicosities and no lower extremity edema present bilateral.   Neruologic: Grossly intact via light  touch bilateral. Vibratory intact via tuning fork bilateral. Protective threshold with Semmes Wienstein monofilament intact to all pedal sites bilateral. Patellar and Achilles deep tendon reflexes 2+ bilateral. No Babinski or clonus noted bilateral.   Musculoskeletal: No gross boney pedal deformities bilateral. No pain, crepitus, or limitation noted with foot and ankle range of motion bilateral. Muscular strength 5/5 in all groups tested bilateral.  She has pain on palpation posterior and inferior calcaneal prominences.  Gait: Unassisted, Nonantalgic.    Radiographs:  Radiographs taken today demonstrate pes  planovalgus as well as soft tissue increase in his plantar fascial cannula insertion site and retrocalcaneal spurring.  Assessment & Plan:   Assessment: Plantar fasciitis insertional Achilles tendinitis pes planovalgus.  Plan: Discussed etiology pathology conservative surgical therapies I offered her an injection she declined she would like to go ahead and start methylprednisolone to be followed by meloxicam.  She saw Natalie Gilbert today for orthotic casting.     Natalie Gilbert T. [place], [place]

## 2022-11-16 ENCOUNTER — Telehealth: Payer: Self-pay | Admitting: Pharmacist

## 2022-11-16 NOTE — Telephone Encounter (Signed)
Called patient to schedule an appointment for the [employer] [facility]. I was unable to reach the patient so I left a HIPAA-compliant message requesting that the patient return my call.   Luke Van Ausdall, PharmD, BCACP, CPP Clinical Pharmacist [facility] [phone]  

## 2022-11-24 NOTE — Telephone Encounter (Signed)
Pt returned call to [facility], I gave her the number to call back for appt

## 2022-11-25 ENCOUNTER — Ambulatory Visit: Payer: 59 | Attending: Family Medicine | Admitting: Pharmacist

## 2022-11-25 DIAGNOSIS — Z7189 Other specified counseling: Secondary | ICD-10-CM

## 2022-11-25 NOTE — Progress Notes (Signed)
   S: Patient presents for review of their specialty medication therapy.  Patient is currently taking Dupixent for atopic dermatitis. Patient is managed by Denny Peon Gray/Dr. Haverstock for this.   Adherence: denies any missed doses.  Efficacy: reports that it continues to work well.  Dosing: 300 mg every other week.  Dose adjustments: Renal: no dose adjustments (has not been studied) Hepatic: no dose adjustments (has not been studied)  Patient denies any adverse effects.  O:     Lab Results  Component Value Date   WBC 9.8 11/12/2021   HGB 12.7 11/12/2021   HCT 38.7 11/12/2021   MCV 90.7 11/12/2021   PLT 387.0 11/12/2021      Chemistry      Component Value Date/Time   NA 138 10/14/2022 1102   K 4.4 10/14/2022 1102   CL 104 10/14/2022 1102   CO2 27 10/14/2022 1102   BUN 11 10/14/2022 1102   CREATININE 0.97 10/14/2022 1102      Component Value Date/Time   CALCIUM 9.9 10/14/2022 1102   ALKPHOS 60 10/14/2022 1102   AST 22 10/14/2022 1102   ALT 17 10/14/2022 1102   BILITOT 0.4 10/14/2022 1102       A/P: 1. Medication review: Patient currently on Dupixent for atopic dermatitis. Patient denies any adverse effects and has no questions about Dupixent at this time. Has been stable on medication for the last few years. Patient encouraged to reach out with any questions. Will follow up in one year.  Butch Penny, PharmD, P[place]y [place], CPP Clinical Pharmacist [facility] [phone]

## 2022-11-30 ENCOUNTER — Other Ambulatory Visit: Payer: Self-pay

## 2022-11-30 NOTE — Progress Notes (Signed)
Specialty Pharmacy Refill Coordination Note  Natalie Gilbert is a 47 y.o. female contacted today regarding refills of specialty medication(s) Dupilumab   Patient requested [name] at [facility] at [place] date: 12/02/22   Medication will be filled on 12/01/22.

## 2022-12-01 ENCOUNTER — Other Ambulatory Visit: Payer: Self-pay

## 2022-12-01 ENCOUNTER — Other Ambulatory Visit (HOSPITAL_COMMUNITY): Payer: Self-pay

## 2022-12-20 ENCOUNTER — Other Ambulatory Visit: Payer: Self-pay

## 2022-12-27 ENCOUNTER — Other Ambulatory Visit: Payer: Self-pay

## 2023-01-09 ENCOUNTER — Other Ambulatory Visit (HOSPITAL_COMMUNITY): Payer: Self-pay

## 2023-01-09 ENCOUNTER — Other Ambulatory Visit: Payer: Self-pay

## 2023-01-09 ENCOUNTER — Encounter (HOSPITAL_COMMUNITY): Payer: Self-pay

## 2023-01-09 NOTE — Progress Notes (Signed)
Specialty Pharmacy Refill Coordination Note  Natalie Gilbert is a 47 y.o. female contacted today regarding refills of specialty medication(s) Dupilumab   Patient requested Delivery   Delivery date: 01/13/23   Verified address: [address]   Medication will be filled on 01/12/23.

## 2023-01-09 NOTE — Progress Notes (Signed)
Specialty Pharmacy Ongoing Clinical Assessment Note  Natalie Gilbert is a 47 y.o. female who is being followed by the specialty pharmacy service for RxSp Atopic Dermatitis   Patient's specialty medication(s) reviewed today: Dupilumab   Missed doses in the last 4 weeks: 0   Patient/Caregiver did not have any additional questions or concerns.   Therapeutic benefit summary: Patient is achieving benefit   Adverse events/side effects summary: No adverse events/side effects   Patient's therapy is appropriate to: Continue    Goals Addressed             This Visit's Progress    Minimize recurrence of flares       Patient is on track. Patient will maintain adherence         Follow up:  6 months  Bobette Mo Specialty Pharmacist

## 2023-01-21 ENCOUNTER — Other Ambulatory Visit (HOSPITAL_COMMUNITY): Payer: Self-pay

## 2023-01-30 ENCOUNTER — Other Ambulatory Visit (HOSPITAL_COMMUNITY): Payer: Self-pay

## 2023-01-30 MED ORDER — DESONIDE 0.05 % EX OINT
1.0000 | TOPICAL_OINTMENT | Freq: Two times a day (BID) | CUTANEOUS | 1 refills | Status: AC
  Filled 2023-01-30: qty 30, 15d supply, fill #0

## 2023-02-08 ENCOUNTER — Other Ambulatory Visit: Payer: Self-pay

## 2023-02-08 NOTE — Progress Notes (Signed)
Specialty Pharmacy Refill Coordination Note  Natalie Gilbert is a 47 y.o. female contacted today regarding refills of specialty medication(s) Dupilumab (Dupixent)   Patient requested Delivery   Delivery date: 02/09/23   Verified address: [address]   Medication will be filled on 12.18.24 .

## 2023-02-10 ENCOUNTER — Other Ambulatory Visit (HOSPITAL_COMMUNITY): Payer: Self-pay

## 2023-03-08 ENCOUNTER — Other Ambulatory Visit: Payer: Self-pay

## 2023-03-08 NOTE — Progress Notes (Signed)
 Specialty Pharmacy Refill Coordination Note  Natalie Gilbert is a 48 y.o. female contacted today regarding refills of specialty medication(s) Dupilumab  (Dupixent )   Patient requested Delivery   Delivery date: 03/24/23   Verified address: [address]   Medication will be filled on 03/23/23, pending refill approval.   Patient no longer has [employer]. 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 Messaged Tiff for PA.

## 2023-03-08 NOTE — Progress Notes (Signed)
 Pharmacy Patient Advocate Encounter   Received notification from Patient Pharmacy that prior authorization for Dupixent  is required/requested.   Insurance verification completed.   The patient is insured through [facility] .   Per test claim: PA required; PA submitted to above mentioned insurance via CoverMyMeds Key/confirmation #/EOC [id number] Status is pending

## 2023-03-09 ENCOUNTER — Other Ambulatory Visit: Payer: Self-pay

## 2023-03-09 ENCOUNTER — Other Ambulatory Visit (HOSPITAL_COMMUNITY): Payer: Self-pay

## 2023-03-10 ENCOUNTER — Other Ambulatory Visit: Payer: Self-pay

## 2023-03-13 ENCOUNTER — Other Ambulatory Visit: Payer: Self-pay

## 2023-03-14 ENCOUNTER — Ambulatory Visit: Payer: BC Managed Care – PPO | Admitting: Family Medicine

## 2023-03-14 ENCOUNTER — Other Ambulatory Visit (HOSPITAL_COMMUNITY): Payer: Self-pay

## 2023-03-14 ENCOUNTER — Encounter: Payer: Self-pay | Admitting: Family Medicine

## 2023-03-14 VITALS — BP 118/80 | HR 76 | Resp 12 | Ht 64.8 in | Wt 187.4 lb

## 2023-03-14 DIAGNOSIS — R42 Dizziness and giddiness: Secondary | ICD-10-CM

## 2023-03-14 DIAGNOSIS — R531 Weakness: Secondary | ICD-10-CM | POA: Diagnosis not present

## 2023-03-14 DIAGNOSIS — K5900 Constipation, unspecified: Secondary | ICD-10-CM | POA: Insufficient documentation

## 2023-03-14 DIAGNOSIS — E785 Hyperlipidemia, unspecified: Secondary | ICD-10-CM | POA: Diagnosis not present

## 2023-03-14 LAB — CBC
HCT: 39.2 % (ref 36.0–46.0)
Hemoglobin: 12.6 g/dL (ref 12.0–15.0)
MCHC: 32.2 g/dL (ref 30.0–36.0)
MCV: 90.5 fL (ref 78.0–100.0)
Platelets: 381 10*3/uL (ref 150.0–400.0)
RBC: 4.33 Mil/uL (ref 3.87–5.11)
RDW: 12.9 % (ref 11.5–15.5)
WBC: 12.2 10*3/uL — ABNORMAL HIGH (ref 4.0–10.5)

## 2023-03-14 LAB — BASIC METABOLIC PANEL
BUN: 11 mg/dL (ref 6–23)
CO2: 29 meq/L (ref 19–32)
Calcium: 10.4 mg/dL (ref 8.4–10.5)
Chloride: 102 meq/L (ref 96–112)
Creatinine, Ser: 1.02 mg/dL (ref 0.40–1.20)
GFR: 66 mL/min (ref >60.00)
Glucose, Bld: 90 mg/dL (ref 70–99)
Potassium: 4.3 meq/L (ref 3.5–5.1)
Sodium: 139 meq/L (ref 135–145)

## 2023-03-14 LAB — VITAMIN B12: Vitamin B-12: 538 pg/mL (ref 211–911)

## 2023-03-14 LAB — C-REACTIVE PROTEIN: CRP: 1.4 mg/dL (ref 0.5–20.0)

## 2023-03-14 LAB — TSH: TSH: 2.03 u[IU]/mL (ref 0.35–5.50)

## 2023-03-14 MED ORDER — LINACLOTIDE 145 MCG PO CAPS
145.0000 ug | ORAL_CAPSULE | Freq: Every day | ORAL | 1 refills | Status: AC
  Filled 2023-03-14: qty 30, 30d supply, fill #0

## 2023-03-14 NOTE — Assessment & Plan Note (Signed)
LDL 191 in 09/2022. She did not tolerate Rosuvastatin well, it caused nausea. For now continue low fat diet, we can re-check FLP in 3-4 months.

## 2023-03-14 NOTE — Progress Notes (Signed)
ACUTE VISIT Chief Complaint  Patient presents with   Dizziness    X 2-3 weeks   Nausea    Has not had any vomiting episodes    Constipation   lack of energy    HPI: [name] is a 48 y.o. female with a PMHx significant for GAD, HLD, and eczema, who is here today complaining of dizziness, nausea, and "weakness" as described above..   Patient complains of dizziness, constipation, and nausea for 2-3 weeks.  Her nausea seems improved for the last week. She describes her dizziness as a movement sensation similar to prior episodes of vertigo she had in the past. She says it happens during the day, especially around meal times in the morning and at noon, and is alleviated by food intake. Dizziness This is a new problem. The current episode started 1 to 4 weeks ago. Pertinent negatives include no chest pain, coughing, joint swelling, myalgias, rash or weakness. She has tried eating for the symptoms. The treatment provided moderate relief.   Denies spinning sensation while in bed, tinnitus, hearing changes, vomiting, or URI prior to symptom onset.  She has not tried OTC medication.  -Her constipation prevented her from having a bowel movement for 3-4 days, and she had to manually disimpact. She says her stomach had been feeling bloated and distended, but this has slightly improved.    She admits she has not been eating vegetables daily or drinking enough water, and drinks sweet tea daily.  She had been taking Ibgard and metamucil and well as Miralax.  No new medications or dietary changes.  She mentions that when she has a bowel movement, all of above described symptoms seem to improve.   -She also complains of left-arm weakness like feeling for about 2 weeks. She is still able to do her daily tasks, but says it takes more effort to use her left arm and hand. She carries a lot of things with her left arm and thinks this is causing the problem.  She is right handed.  She believes  the weakness is also improving.  Denies focal neurologic deficit, numbness, tingling, neck pain, or headache.   She also adds she had congestion for one day on 1/17. Denies fever or chills.   Hyperlipidemia: She says she has not been taking her rosuvastatin very consistently. Medication caused nausea. She has not been consistent following low fat diet.  Lab Results  Component Value Date   CHOL 268 (H) 10/14/2022   HDL 46.90 10/14/2022   LDLCALC 191 (H) 10/14/2022   LDLDIRECT 141.0 08/12/2020   TRIG 147.0 10/14/2022   CHOLHDL 6 10/14/2022   Review of Systems  Constitutional:  Negative for activity change and appetite change.  Respiratory:  Negative for cough, shortness of breath and wheezing.   Cardiovascular:  Negative for chest pain, palpitations and leg swelling.  Endocrine: Negative for cold intolerance and heat intolerance.  Genitourinary:  Negative for decreased urine volume, dysuria and hematuria.  Musculoskeletal:  Negative for joint swelling and myalgias.  Skin:  Negative for rash.  Neurological:  Positive for dizziness. Negative for syncope and weakness.  See other pertinent positives and negatives in HPI.  Current Outpatient Medications on File Prior to Visit  Medication Sig Dispense Refill   albuterol (VENTOLIN HFA) 108 (90 Base) MCG/ACT inhaler Inhale 2 puffs into the lungs every 6 (six) hours as needed for wheezing or shortness of breath. 6.7 g 0   cetirizine (ZYRTEC) 10 MG tablet TAKE 1  TABLET BY MOUTH EVERY DAY 30 tablet 0   clobetasol ointment (TEMOVATE) 0.05 % Apply to affected areas daily at night 60 g 0   cyclobenzaprine (FLEXERIL) 10 MG tablet Take 1/2 to 1 tablet by mouth every 8 hours as needed for muscle pain/spasms. May cause drowsiness. 15 tablet 0   desonide (DESOWEN) 0.05 % ointment Apply 1 Application topically 2 (two) times daily. 30 g 1   diclofenac (VOLTAREN) 75 MG EC tablet Take 1 tablet (75 mg total) by mouth every 12 (twelve) hours as needed for  moderate pain. 15 tablet 0   dupilumab (DUPIXENT) 300 MG/2ML prefilled syringe USE 1 INJECTION EVERY OTHER WEEK UNDER THE SKIN 30 days 4 mL 5   ketoconazole (NIZORAL) 2 % shampoo Apply 1 Application topically 2 (two) times a week. Lather, leave on for prior to rinsing off. 120 mL 0   meloxicam (MOBIC) 15 MG tablet Take 1 tablet (15 mg total) by mouth daily. 30 tablet 3   methylPREDNISolone (MEDROL DOSEPAK) 4 MG TBPK tablet Take as directed per package instructions. 21 tablet 0   MICROGESTIN FE 1/20 1-20 MG-MCG tablet      ondansetron (ZOFRAN) 4 MG tablet Take 1 tablet (4 mg total) by mouth as directed for 2 doses. Take one Zofran 4 mg tablet 30-60 minutes before each prep dose 2 tablet 0   rosuvastatin (CRESTOR) 10 MG tablet Take 1 tablet (10 mg total) by mouth daily. 90 tablet 3   triamcinolone cream (KENALOG) 0.1 % Apply to affected area(s) twice daily for 14 days. 454 g 1   No current facility-administered medications on file prior to visit.    Past Medical History:  Diagnosis Date   Allergy    seasonal, animal dander   Anxiety    Depression    Eczema    Heart murmur    Allergies  Allergen Reactions   Latex     Other Reaction(s): Unknown    Social History   Socioeconomic History   Marital status: Single    Spouse name: Not on file   Number of children: Not on file   Years of education: Not on file   Highest education level: Bachelor's degree (e.g., BA, AB, BS)  Occupational History   Not on file  Tobacco Use   Smoking status: Never   Smokeless tobacco: Never  Vaping Use   Vaping status: Never Used  Substance and Sexual Activity   Alcohol use: Never   Drug use: Never   Sexual activity: Yes    Birth control/protection: Spermicide, Pill  Other Topics Concern   Not on file  Social History Narrative   Not on file   Social Drivers of Health   Financial Resource Strain: Low Risk  (10/13/2022)   Overall Financial Resource Strain (CARDIA)    Difficulty of Paying  Living Expenses: Not hard at all  Food Insecurity: No Food Insecurity (10/13/2022)   Hunger Vital Sign    Worried About Running Out of Food in the Last Year: Never true    Ran Out of Food in the Last Year: Never true  Transportation Needs: No Transportation Needs (10/13/2022)   PRAPARE - Transportation    Lack of Transportation (Medical): No    Lack of Transportation (Non-Medical): No  Physical Activity: Sufficiently Active (10/13/2022)   Exercise Vital Sign    Days of Exercise per Week: 3 days    Minutes of Exercise per Session: 150+ min  Stress: No Stress Concern Present (10/13/2022)  [employer] of Occupational Health - Occupational Stress Questionnaire    Feeling of Stress : Only a little  Social Connections: Moderately Integrated (10/13/2022)   Social Connection and Isolation Panel [NHANES]    Frequency of Communication with Friends and Family: More than three times a week    Frequency of Social Gatherings with Friends and Family: Once a week    Attends Religious Services: More than 4 times per year    Active Member of [employer] or Organizations: Yes    Attends Banker Meetings: More than 4 times per year    Marital Status: Never married   Vitals:   03/14/23 1542  BP: 118/80  Pulse: 76  Resp: 12  SpO2: 96%   Body mass index is 31.37 kg/m.  Physical Exam Vitals and nursing note reviewed.  Constitutional:      General: She is not in acute distress.    Appearance: She is well-developed.  HENT:     Head: Normocephalic and atraumatic.     Right Ear: Tympanic membrane, ear canal and external ear normal.     Left Ear: Tympanic membrane, ear canal and external ear normal.     Mouth/Throat:     Mouth: Mucous membranes are moist.     Pharynx: Oropharynx is clear. Uvula midline.  Eyes:     Conjunctiva/sclera: Conjunctivae normal.  Cardiovascular:     Rate and Rhythm: Normal rate and regular rhythm.     Pulses:          Posterior tibial pulses are 2+ on the  right side and 2+ on the left side.     Heart sounds: No murmur heard. Pulmonary:     Effort: Pulmonary effort is normal. No respiratory distress.     Breath sounds: Normal breath sounds.  Abdominal:     Palpations: Abdomen is soft. There is no hepatomegaly or mass.     Tenderness: There is no abdominal tenderness.  Musculoskeletal:     Right lower leg: No edema.     Left lower leg: No edema.  Lymphadenopathy:     Cervical: No cervical adenopathy.  Skin:    General: Skin is warm.     Findings: No erythema or rash.  Neurological:     General: No focal deficit present.     Mental Status: She is alert and oriented to person, place, and time.     Cranial Nerves: No cranial nerve deficit.     Sensory: No sensory deficit.     Motor: No weakness.     Gait: Gait normal.     Deep Tendon Reflexes:     Reflex Scores:      Bicep reflexes are 2+ on the right side and 2+ on the left side.      Patellar reflexes are 2+ on the right side and 2+ on the left side. Psychiatric:        Mood and Affect: Mood and affect normal.   ASSESSMENT AND PLAN:  Ms. Snide was seen today for dizziness, constipation, and weakness.  Lab Results  Component Value Date   TSH 2.03 03/14/2023   Lab Results  Component Value Date   WBC 12.2 (H) 03/14/2023   HGB 12.6 03/14/2023   HCT 39.2 03/14/2023   MCV 90.5 03/14/2023   PLT 381.0 03/14/2023   Lab Results  Component Value Date   VITAMINB12 538 03/14/2023   Lab Results  Component Value Date   NA 139 03/14/2023   CL 102 03/14/2023  K 4.3 03/14/2023   CO2 29 03/14/2023   BUN 11 03/14/2023   CREATININE 1.02 03/14/2023   GFR 66.00 03/14/2023   CALCIUM 10.4 03/14/2023   ALBUMIN 4.2 10/14/2022   GLUCOSE 90 03/14/2023   Lab Results  Component Value Date   CRP 1.4 03/14/2023   Constipation, unspecified constipation type Assessment & Plan: Problem has improved after starting Miralax and Ibgard . Colonoscopy 01/2022.  Stressed the importance of  adequate hydration and fiber intake. Benefiber 1 tsp bid. Continue Miralax q 2 days and Bisacodyl 5 mg daily as needed. She had samples of Linzess she got at work, 72 mcg, recommend trying 1-2 caps q 2-3 days as needed if OTC treatments do not help.Rx for Linzess 145 mcg sent, side effects discussed.  Orders: -     linaCLOtide; Take 1 capsule (145 mcg total) by mouth daily before breakfast.  Dispense: 30 capsule; Refill: 1  Weakness Perception of LUE weakness, neuro exam normal. We discussed options at this time, she prefers to hold on brain imaging for now. Clearly instructed about warning signs.  -     Basic metabolic panel; Future -     CBC; Future -     TSH; Future -     Vitamin B12; Future -     C-reactive protein; Future  Dizziness Problem has greatly improved. Avoid identified trigger factors, try a protein content snack in between main meals. Fall precautions. Instructed about warning signs.  Hyperlipidemia, unspecified hyperlipidemia type Assessment & Plan: LDL 191 in 09/2022. She did not tolerate Rosuvastatin well, it caused nausea. For now continue low fat diet, we can re-check FLP in 3-4 months.      Return in about 2 months (around 05/12/2023), or if symptoms worsen or fail to improve.  I, Rolla Etienne Wierda, acting as a scribe for Tawsha Terrero [place], MD., have documented all relevant documentation on the behalf of Somaly Marteney [place], MD, as directed by  Emmalie Haigh [place], MD while in the presence of Josecarlos Harriott [place], MD.   I, Dustin Bumbaugh [place], MD, have reviewed all documentation for this visit. The documentation on 03/14/23 for the exam, diagnosis, procedures, and orders are all accurate and complete.  Nickia Boesen G. [place], MD  [facility]. [place] office.

## 2023-03-14 NOTE — Assessment & Plan Note (Signed)
Problem has improved after starting Miralax and Ibgard . Colonoscopy 01/2022.  Stressed the importance of adequate hydration and fiber intake. Benefiber 1 tsp bid. Continue Miralax q 2 days and Bisacodyl 5 mg daily as needed. She had samples of Linzess she got at work, 72 mcg, recommend trying 1-2 caps q 2-3 days as needed if OTC treatments do not help.Rx for Linzess 145 mcg sent, side effects discussed.

## 2023-03-14 NOTE — Patient Instructions (Addendum)
A few things to remember from today's visit:  Weakness - Plan: Basic metabolic panel, CBC, TSH, Vitamin B12, C-reactive protein, C-reactive protein, Vitamin B12, TSH, CBC, Basic metabolic panel  Constipation, unspecified constipation type - Plan: linaclotide (LINZESS) 145 MCG CAPS capsule Try Linzess every 3rd day if Miralax and Bisacodyl do not help. Benefiber 1 tsp 2 times daily for fiber. Monitor for new symptoms.  Fasting labs in 3-4 months.  If you need refills for medications you take chronically, please call your pharmacy. Do not use My Chart to request refills or for acute issues that need immediate attention. If you send a my chart message, it may take a few days to be addressed, specially if I am not in the office.  Please be sure medication list is accurate. If a new problem present, please set up appointment sooner than planned today.

## 2023-03-15 ENCOUNTER — Other Ambulatory Visit: Payer: Self-pay

## 2023-03-15 NOTE — Progress Notes (Signed)
Pharmacy Patient Advocate Encounter  Received notification from [facility] that Prior Authorization for Dupixent has been APPROVED from 03/08/23 to 03/07/24   PA #/Case ID/Reference #: Nicholes Stairs

## 2023-03-16 ENCOUNTER — Other Ambulatory Visit: Payer: Self-pay

## 2023-03-16 ENCOUNTER — Other Ambulatory Visit (HOSPITAL_COMMUNITY): Payer: Self-pay

## 2023-03-22 ENCOUNTER — Other Ambulatory Visit (HOSPITAL_COMMUNITY): Payer: Self-pay

## 2023-03-23 ENCOUNTER — Other Ambulatory Visit: Payer: Self-pay | Admitting: Family Medicine

## 2023-03-23 ENCOUNTER — Other Ambulatory Visit: Payer: Self-pay

## 2023-03-24 ENCOUNTER — Other Ambulatory Visit: Payer: Self-pay

## 2023-03-24 NOTE — Progress Notes (Addendum)
 Refills denied - refill not appropriate. Left voice mail on nurses' line, explaining that now that patient no longer had [facility] insurance we could no longer use the rewrite. Left voice mail for patient, requesting that she follow up with her MD.  As of 2/7, there is still no prescription. Left another detailed voice mail for patient.

## 2023-03-31 ENCOUNTER — Ambulatory Visit: Payer: BC Managed Care – PPO | Admitting: Family Medicine

## 2023-03-31 ENCOUNTER — Other Ambulatory Visit: Payer: Self-pay

## 2023-04-18 ENCOUNTER — Other Ambulatory Visit: Payer: Self-pay

## 2023-04-19 ENCOUNTER — Other Ambulatory Visit: Payer: Self-pay

## 2023-04-19 ENCOUNTER — Other Ambulatory Visit (HOSPITAL_COMMUNITY): Payer: Self-pay

## 2023-07-03 ENCOUNTER — Other Ambulatory Visit: Payer: Self-pay

## 2023-07-04 ENCOUNTER — Other Ambulatory Visit: Payer: Self-pay

## 2023-07-05 ENCOUNTER — Other Ambulatory Visit: Payer: Self-pay | Admitting: Pharmacy Technician

## 2023-07-05 ENCOUNTER — Other Ambulatory Visit: Payer: Self-pay

## 2023-07-05 NOTE — Progress Notes (Signed)
 Disenrolled; Last filled 01/2023.   04/19/23-Patient called back & MD wont send in a new rx until patient has a Appt. & patient aware to give us  a call when that is handle. See notes from last encounter

## 2023-07-12 DIAGNOSIS — Z01419 Encounter for gynecological examination (general) (routine) without abnormal findings: Secondary | ICD-10-CM | POA: Diagnosis not present

## 2023-07-12 DIAGNOSIS — Z124 Encounter for screening for malignant neoplasm of cervix: Secondary | ICD-10-CM | POA: Diagnosis not present

## 2023-07-12 DIAGNOSIS — Z1231 Encounter for screening mammogram for malignant neoplasm of breast: Secondary | ICD-10-CM | POA: Diagnosis not present

## 2023-07-12 DIAGNOSIS — D251 Intramural leiomyoma of uterus: Secondary | ICD-10-CM | POA: Diagnosis not present

## 2023-07-12 DIAGNOSIS — Z130 Encounter for screening for diseases of the blood and blood-forming organs and certain disorders involving the immune mechanism: Secondary | ICD-10-CM | POA: Diagnosis not present

## 2023-07-12 DIAGNOSIS — Z202 Contact with and (suspected) exposure to infections with a predominantly sexual mode of transmission: Secondary | ICD-10-CM | POA: Diagnosis not present

## 2023-07-12 DIAGNOSIS — N841 Polyp of cervix uteri: Secondary | ICD-10-CM | POA: Diagnosis not present

## 2023-07-12 DIAGNOSIS — Z113 Encounter for screening for infections with a predominantly sexual mode of transmission: Secondary | ICD-10-CM | POA: Diagnosis not present

## 2023-07-12 DIAGNOSIS — Z1151 Encounter for screening for human papillomavirus (HPV): Secondary | ICD-10-CM | POA: Diagnosis not present

## 2023-07-12 DIAGNOSIS — R319 Hematuria, unspecified: Secondary | ICD-10-CM | POA: Diagnosis not present

## 2023-07-20 ENCOUNTER — Other Ambulatory Visit (HOSPITAL_COMMUNITY): Payer: Self-pay

## 2023-07-20 DIAGNOSIS — L209 Atopic dermatitis, unspecified: Secondary | ICD-10-CM | POA: Diagnosis not present

## 2023-07-20 MED ORDER — DESONIDE 0.05 % EX OINT
1.0000 | TOPICAL_OINTMENT | Freq: Two times a day (BID) | CUTANEOUS | 1 refills | Status: AC
  Filled 2023-07-20 – 2023-07-26 (×2): qty 30, 15d supply, fill #0

## 2023-07-24 ENCOUNTER — Other Ambulatory Visit: Payer: Self-pay

## 2023-07-24 ENCOUNTER — Other Ambulatory Visit (HOSPITAL_COMMUNITY): Payer: Self-pay

## 2023-07-24 MED ORDER — DUPIXENT 300 MG/2ML ~~LOC~~ SOAJ
300.0000 mg | SUBCUTANEOUS | 5 refills | Status: AC
  Filled 2023-07-26: qty 4, 28d supply, fill #0

## 2023-07-24 NOTE — Progress Notes (Signed)
 Left voicemail for patient. Received a new prescription for Dupixent . Prescription is on hold. Waiting on call back before enrolling.

## 2023-07-25 ENCOUNTER — Other Ambulatory Visit (HOSPITAL_COMMUNITY): Payer: Self-pay

## 2023-07-26 ENCOUNTER — Other Ambulatory Visit (HOSPITAL_COMMUNITY): Payer: Self-pay

## 2023-07-26 ENCOUNTER — Other Ambulatory Visit: Payer: Self-pay

## 2023-07-26 NOTE — Progress Notes (Signed)
 Pharmacy Patient Advocate Encounter  Insurance verification completed.   The patient is insured through [employer]   Ran test claim for Dupixent . Co-pay is $0. Patient has copay card and dupixent  credit card.  This test claim was processed through [facility]- copay amounts may vary at other pharmacies due to pharmacy/plan contracts, or as the patient moves through the different stages of their insurance plan.

## 2023-07-26 NOTE — Progress Notes (Signed)
 Patient does not need until 08/31/23. She wants to talk to provider about changing to syringes-she does not like the pens. We will reach out the week on 6/23 if we have not heard from her or provider has not sent in new prescription by then.

## 2023-08-28 ENCOUNTER — Other Ambulatory Visit: Payer: Self-pay

## 2023-08-28 ENCOUNTER — Other Ambulatory Visit (HOSPITAL_COMMUNITY): Payer: Self-pay

## 2023-08-28 NOTE — Progress Notes (Signed)
 Specialty Pharmacy Initiation Note   Natalie Gilbert is a 48 y.o. female who will be followed by the specialty pharmacy service for RxSp Atopic Dermatitis    Review of administration, indication, effectiveness, safety, potential side effects, storage/disposable, and missed dose instructions occurred today for patient's specialty medication(s) Dupilumab  (Dupixent )     Patient/Caregiver asked additional questions regarding pen device failing. Patient working with Annabella to have MD send in prefilled syringe that she had used in the past instead.  Patient's therapy is appropriate to: Continue (Patient restarted in early June using samples from provider, had been off therapy since March.)    Goals Addressed             This Visit's Progress    Minimize recurrence of flares       Patient is unable to be assessed as therapy was recently initiated. Patient will maintain adherence and avoid flare triggers. Patient restarted in early June using samples from provider, had been off therapy since March.          Jsoeph Podesta M Atarah Cadogan Specialty Pharmacist

## 2023-08-29 ENCOUNTER — Other Ambulatory Visit (HOSPITAL_COMMUNITY): Payer: Self-pay

## 2023-08-29 ENCOUNTER — Other Ambulatory Visit: Payer: Self-pay

## 2023-08-29 MED ORDER — DUPILUMAB 300 MG/2ML ~~LOC~~ SOSY
300.0000 mg | PREFILLED_SYRINGE | SUBCUTANEOUS | 5 refills | Status: AC
  Filled 2023-09-11 (×2): qty 4, 28d supply, fill #0

## 2023-08-29 NOTE — Progress Notes (Addendum)
 Patient's insurance requires her to fill with their preferred specialty pharmacy. She must call customer service number on back of card to get more info. Advised her to have doctor send new prescription to preferred pharmacy or have them call us  for transfer. Dis-enrolling.

## 2023-09-11 ENCOUNTER — Other Ambulatory Visit: Payer: Self-pay

## 2023-10-02 ENCOUNTER — Other Ambulatory Visit: Payer: Self-pay

## 2023-10-04 ENCOUNTER — Other Ambulatory Visit: Payer: Self-pay

## 2023-10-25 DIAGNOSIS — Z113 Encounter for screening for infections with a predominantly sexual mode of transmission: Secondary | ICD-10-CM | POA: Diagnosis not present

## 2023-10-25 DIAGNOSIS — N949 Unspecified condition associated with female genital organs and menstrual cycle: Secondary | ICD-10-CM | POA: Diagnosis not present

## 2024-01-15 DIAGNOSIS — H52223 Regular astigmatism, bilateral: Secondary | ICD-10-CM | POA: Diagnosis not present

## 2024-02-20 ENCOUNTER — Other Ambulatory Visit: Payer: Self-pay

## 2024-03-18 ENCOUNTER — Other Ambulatory Visit: Payer: Self-pay
